# Patient Record
Sex: Female | Born: 1950 | Race: White | Hispanic: No | Smoking: Former smoker
Health system: Southern US, Community
[De-identification: ages and names within clinical notes are randomized; demographics above are authoritative.]

## PROBLEM LIST (undated history)

## (undated) DIAGNOSIS — M8580 Other specified disorders of bone density and structure, unspecified site: Secondary | ICD-10-CM

## (undated) DIAGNOSIS — F32A Depression, unspecified: Secondary | ICD-10-CM

## (undated) DIAGNOSIS — M5412 Radiculopathy, cervical region: Secondary | ICD-10-CM

## (undated) DIAGNOSIS — F419 Anxiety disorder, unspecified: Secondary | ICD-10-CM

## (undated) DIAGNOSIS — M549 Dorsalgia, unspecified: Secondary | ICD-10-CM

## (undated) DIAGNOSIS — M545 Low back pain, unspecified: Secondary | ICD-10-CM

## (undated) DIAGNOSIS — R7989 Other specified abnormal findings of blood chemistry: Secondary | ICD-10-CM

## (undated) DIAGNOSIS — R748 Abnormal levels of other serum enzymes: Secondary | ICD-10-CM

## (undated) DIAGNOSIS — G4700 Insomnia, unspecified: Secondary | ICD-10-CM

## (undated) DIAGNOSIS — K589 Irritable bowel syndrome without diarrhea: Secondary | ICD-10-CM

## (undated) DIAGNOSIS — M542 Cervicalgia: Secondary | ICD-10-CM

## (undated) DIAGNOSIS — F329 Major depressive disorder, single episode, unspecified: Secondary | ICD-10-CM

## (undated) HISTORY — DX: Anxiety disorder, unspecified: F41.9

## (undated) HISTORY — DX: Low back pain: M54.5

## (undated) HISTORY — PX: OTHER SURGICAL HISTORY: SHX169

## (undated) HISTORY — DX: Dorsalgia, unspecified: M54.9

## (undated) HISTORY — PX: CHOLESTEATOMA EXCISION: SHX1345

## (undated) HISTORY — DX: Other specified disorders of bone density and structure, unspecified site: M85.80

## (undated) HISTORY — DX: Abnormal levels of other serum enzymes: R74.8

## (undated) HISTORY — DX: Depression, unspecified: F32.A

## (undated) HISTORY — DX: Insomnia, unspecified: G47.00

## (undated) HISTORY — DX: Irritable bowel syndrome, unspecified: K58.9

## (undated) HISTORY — DX: Other specified abnormal findings of blood chemistry: R79.89

## (undated) HISTORY — DX: Low back pain, unspecified: M54.50

## (undated) HISTORY — PX: CHOLECYSTECTOMY: SHX55

## (undated) HISTORY — PX: SKIN GRAFT: SHX250

## (undated) HISTORY — DX: Radiculopathy, cervical region: M54.12

## (undated) HISTORY — DX: Cervicalgia: M54.2

## (undated) HISTORY — DX: Major depressive disorder, single episode, unspecified: F32.9

---

## 2004-06-05 LAB — HM COLONOSCOPY

## 2013-10-05 LAB — HM PAP SMEAR

## 2013-10-05 LAB — HM MAMMOGRAPHY

## 2013-10-29 LAB — HM DEXA SCAN

## 2015-11-27 ENCOUNTER — Ambulatory Visit: Payer: 59

## 2015-11-27 DIAGNOSIS — Z23 Encounter for immunization: Secondary | ICD-10-CM

## 2015-12-12 ENCOUNTER — Encounter: Payer: Self-pay | Admitting: Unknown Physician Specialty

## 2015-12-12 ENCOUNTER — Ambulatory Visit (INDEPENDENT_AMBULATORY_CARE_PROVIDER_SITE_OTHER): Payer: 59 | Admitting: Unknown Physician Specialty

## 2015-12-12 ENCOUNTER — Telehealth: Payer: Self-pay | Admitting: Unknown Physician Specialty

## 2015-12-12 VITALS — BP 145/94 | HR 62 | Temp 98.3°F | Ht 65.3 in | Wt 145.0 lb

## 2015-12-12 DIAGNOSIS — E785 Hyperlipidemia, unspecified: Secondary | ICD-10-CM

## 2015-12-12 DIAGNOSIS — F419 Anxiety disorder, unspecified: Secondary | ICD-10-CM

## 2015-12-12 DIAGNOSIS — R059 Cough, unspecified: Secondary | ICD-10-CM

## 2015-12-12 DIAGNOSIS — R05 Cough: Secondary | ICD-10-CM

## 2015-12-12 DIAGNOSIS — F339 Major depressive disorder, recurrent, unspecified: Secondary | ICD-10-CM | POA: Insufficient documentation

## 2015-12-12 DIAGNOSIS — M545 Low back pain, unspecified: Secondary | ICD-10-CM | POA: Insufficient documentation

## 2015-12-12 DIAGNOSIS — M85852 Other specified disorders of bone density and structure, left thigh: Secondary | ICD-10-CM | POA: Insufficient documentation

## 2015-12-12 DIAGNOSIS — R748 Abnormal levels of other serum enzymes: Secondary | ICD-10-CM

## 2015-12-12 DIAGNOSIS — G4700 Insomnia, unspecified: Secondary | ICD-10-CM | POA: Insufficient documentation

## 2015-12-12 DIAGNOSIS — M8580 Other specified disorders of bone density and structure, unspecified site: Secondary | ICD-10-CM

## 2015-12-12 LAB — INFLUENZA A AND B
INFLUENZA B AG, EIA: NEGATIVE
Influenza A Ag, EIA: POSITIVE — AB

## 2015-12-12 MED ORDER — OSELTAMIVIR PHOSPHATE 75 MG PO CAPS: 75.0000 mg | ORAL_CAPSULE | Freq: Two times a day (BID) | ORAL | Status: DC

## 2015-12-12 MED ORDER — OSELTAMIVIR PHOSPHATE 30 MG PO CAPS: 30.0000 mg | ORAL_CAPSULE | Freq: Two times a day (BID) | ORAL | Status: DC

## 2015-12-12 NOTE — Telephone Encounter (Signed)
[facility] says she can't find tamiflu  but they do have it in  and would like to know if the rx can be changed.

## 2015-12-12 NOTE — Telephone Encounter (Signed)
Wrote another rx

## 2015-12-12 NOTE — Progress Notes (Signed)
--  j---  BP 145/94 mmHg  Pulse 62  Temp(Src) 98.3 F (36.8 C)  Ht 5' 5.3" (1.659 m)  Wt 145 lb (65.772 kg)  BMI 23.90 kg/m2  SpO2 99%  LMP  (LMP Unknown)   Subjective:    Patient ID: Kristen May, female    DOB: December 05, 1950, 65 y.o.   MRN: [id number]  HPI: Kristen May is a 65 y.o. female  Chief Complaint  Patient presents with  . URI    pt states she has been sneezing, having a stuffy nose, some congestion, runny nose, chills, and body aches. States symptoms started yesterday.    Relevant past medical, surgical, family and social history reviewed and updated as indicated. Interim medical history since our last visit reviewed. Allergies and medications reviewed and updated.  Review of Systems  Per HPI unless specifically indicated above     Objective:    BP 145/94 mmHg  Pulse 62  Temp(Src) 98.3 F (36.8 C)  Ht 5' 5.3" (1.659 m)  Wt 145 lb (65.772 kg)  BMI 23.90 kg/m2  SpO2 99%  LMP  (LMP Unknown)  Wt Readings from Last 3 Encounters:  12/12/15 145 lb (65.772 kg)  01/31/15 162 lb (73.483 kg)    Physical Exam  Constitutional: She is oriented to person, place, and time. She appears well-developed and well-nourished. No distress.  HENT:  Head: Normocephalic and atraumatic.  Right Ear: Tympanic membrane and ear canal normal.  Left Ear: Tympanic membrane and ear canal normal.  Nose: Rhinorrhea present. Right sinus exhibits no maxillary sinus tenderness and no frontal sinus tenderness. Left sinus exhibits no maxillary sinus tenderness and no frontal sinus tenderness.  Mouth/Throat: Mucous membranes are normal. Posterior oropharyngeal erythema present.  Eyes: Conjunctivae and lids are normal. Right eye exhibits no discharge. Left eye exhibits no discharge. No scleral icterus.  Cardiovascular: Normal rate and regular rhythm.   Pulmonary/Chest: Effort normal and breath sounds normal. No respiratory distress.  Abdominal: Normal appearance. There is no splenomegaly or  hepatomegaly.  Musculoskeletal: Normal range of motion.  Neurological: She is alert and oriented to person, place, and time.  Skin: Skin is intact. No rash noted. No pallor.  Psychiatric: She has a normal mood and affect. Her behavior is normal. Judgment and thought content normal.  Nursing note and vitals reviewed.   Results for orders placed or performed in visit on 12/12/15  HM DEXA SCAN  Result Value Ref Range   HM Dexa Scan from [facility]   HM MAMMOGRAPHY  Result Value Ref Range   HM Mammogram patient reported from [facility]   HM PAP SMEAR  Result Value Ref Range   HM Pap smear from [facility]   HM COLONOSCOPY  Result Value Ref Range   HM Colonoscopy from [facility]       Assessment & Plan:   Problem List Items Addressed This Visit    None    Visit Diagnoses    Cough    -  Primary    positive for flu.  Rx for Tamiflu    Relevant Orders    Influenza a and b        Follow up plan: Return if symptoms worsen or fail to improve.

## 2015-12-12 NOTE — Telephone Encounter (Signed)
Routing to provider  

## 2015-12-22 ENCOUNTER — Ambulatory Visit (INDEPENDENT_AMBULATORY_CARE_PROVIDER_SITE_OTHER): Payer: 59 | Admitting: Unknown Physician Specialty

## 2015-12-22 ENCOUNTER — Encounter: Payer: Self-pay | Admitting: Unknown Physician Specialty

## 2015-12-22 ENCOUNTER — Ambulatory Visit
Admission: RE | Admit: 2015-12-22 | Discharge: 2015-12-22 | Disposition: A | Discharge status: Home / Self Care | Payer: 59 | Source: Ambulatory Visit | Attending: Unknown Physician Specialty | Admitting: Unknown Physician Specialty

## 2015-12-22 VITALS — BP 126/86 | HR 69 | Temp 98.2°F | Ht 65.5 in | Wt 149.0 lb

## 2015-12-22 DIAGNOSIS — R0789 Other chest pain: Secondary | ICD-10-CM | POA: Insufficient documentation

## 2015-12-22 DIAGNOSIS — M546 Pain in thoracic spine: Secondary | ICD-10-CM | POA: Diagnosis not present

## 2015-12-22 MED ORDER — NAPROXEN 500 MG PO TABS: 500.0000 mg | ORAL_TABLET | Freq: Two times a day (BID) | ORAL | Status: DC

## 2015-12-22 MED ORDER — CYCLOBENZAPRINE HCL 10 MG PO TABS: 10.0000 mg | ORAL_TABLET | Freq: Every day | ORAL | Status: DC
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
## 2016-01-20 ENCOUNTER — Encounter: Payer: Self-pay | Admitting: Family Medicine

## 2016-01-20 ENCOUNTER — Ambulatory Visit (INDEPENDENT_AMBULATORY_CARE_PROVIDER_SITE_OTHER): Payer: 59 | Admitting: Family Medicine

## 2016-01-20 VITALS — BP 149/84 | HR 59 | Temp 98.1°F | Ht 64.6 in | Wt 146.0 lb

## 2016-01-20 DIAGNOSIS — J029 Acute pharyngitis, unspecified: Secondary | ICD-10-CM

## 2016-01-20 MED ORDER — BENZONATATE 200 MG PO CAPS: 200.0000 mg | ORAL_CAPSULE | Freq: Two times a day (BID) | ORAL | Status: DC | PRN

## 2016-01-20 NOTE — Progress Notes (Signed)
BP 149/84 mmHg  Pulse 59  Temp(Src) 98.1 F (36.7 C)  Ht 5' 4.6" (1.641 m)  Wt 146 lb (66.225 kg)  BMI 24.59 kg/m2  SpO2 98%  LMP  (LMP Unknown)   Subjective:    Patient ID: Kristen May, female    DOB: 1950/11/30, 65 y.o.   MRN: [id number]  HPI: Kristen May is a 65 y.o. female  Chief Complaint  Patient presents with  . Sore Throat    patient states that on Sunday her throat felt as if it was constricted. Now it is sore   UPPER RESPIRATORY TRACT INFECTION Duration: Since sunday Worst symptom: Sore throat Fever: no Cough: no Shortness of breath: no Wheezing: no Chest pain: no Chest tightness: no Chest congestion: no Nasal congestion: no Runny nose: no Post nasal drip: yes Sneezing: no Sore throat: yes Swollen glands: unknown Sinus pressure: no Headache: no Face pain: no Toothache: no Ear pain: no  Ear pressure: no  Eyes red/itching:yes Eye drainage/crusting: no  Vomiting: no Rash: no Fatigue: yes Sick contacts: no Strep contacts: no  Context: worse Recurrent sinusitis: no Relief with OTC cold/cough medications: no  Treatments attempted: none  Nothing getting stuck Not feeling hoarse.  Description of symptom: Throat feels tight Onset: Just there, not a problem with the swollowing Location of dysphagia: throat  Relevant past medical, surgical, family and social history reviewed and updated as indicated. Interim medical history since our last visit reviewed. Allergies and medications reviewed and updated.  Review of Systems  Constitutional: Negative.   HENT: Positive for postnasal drip, rhinorrhea and sore throat. Negative for congestion, dental problem, drooling, ear discharge, ear pain, facial swelling, hearing loss, mouth sores, nosebleeds, sinus pressure, sneezing, tinnitus, trouble swallowing and voice change.   Respiratory: Negative.   Cardiovascular: Negative.   Psychiatric/Behavioral: Negative.     Per HPI unless specifically indicated above      Objective:    BP 149/84 mmHg  Pulse 59  Temp(Src) 98.1 F (36.7 C)  Ht 5' 4.6" (1.641 m)  Wt 146 lb (66.225 kg)  BMI 24.59 kg/m2  SpO2 98%  LMP  (LMP Unknown)  Wt Readings from Last 3 Encounters:  01/20/16 146 lb (66.225 kg)  12/22/15 149 lb (67.586 kg)  12/12/15 145 lb (65.772 kg)    Physical Exam  Constitutional: She is oriented to person, place, and time. She appears well-developed and well-nourished. No distress.  HENT:  Head: Normocephalic and atraumatic.  Right Ear: Hearing and external ear normal.  Left Ear: Hearing and external ear normal.  Nose: Nose normal.  Mouth/Throat: Oropharynx is clear and moist. No oropharyngeal exudate.  + Post nasal drip   Eyes: Conjunctivae, EOM and lids are normal. Pupils are equal, round, and reactive to light. Right eye exhibits no discharge. Left eye exhibits no discharge. No scleral icterus.  Neck: Normal range of motion. Neck supple. No JVD present. No tracheal deviation present. No thyromegaly present.  Cardiovascular: Normal rate, regular rhythm, normal heart sounds and intact distal pulses.  Exam reveals no gallop and no friction rub.   No murmur heard. Pulmonary/Chest: Effort normal and breath sounds normal. No stridor. No respiratory distress. She has no wheezes. She has no rales. She exhibits no tenderness.  Musculoskeletal: Normal range of motion.  Lymphadenopathy:    She has cervical adenopathy.  Neurological: She is alert and oriented to person, place, and time.  Skin: Skin is warm, dry and intact. No rash noted. She is not diaphoretic. No pallor.  Psychiatric:  She has a normal mood and affect. Her speech is normal and behavior is normal. Judgment and thought content normal. Cognition and memory are normal.  Nursing note and vitals reviewed.       Assessment & Plan:   Problem List Items Addressed This Visit    None    Visit Diagnoses    Pharyngitis    -  Primary    Will treat with tessalon perles for comfort.  Continue to monitor. Call if not getting better or getting worse. Recommended OTC antihistamines.         Follow up plan: Return if symptoms worsen or fail to improve.

## 2016-01-20 NOTE — Patient Instructions (Signed)

## 2016-04-02 ENCOUNTER — Ambulatory Visit (INDEPENDENT_AMBULATORY_CARE_PROVIDER_SITE_OTHER): Payer: 59 | Admitting: Unknown Physician Specialty

## 2016-04-02 ENCOUNTER — Encounter: Payer: Self-pay | Admitting: Unknown Physician Specialty

## 2016-04-02 VITALS — BP 123/83 | HR 60 | Temp 98.1°F | Ht 65.6 in | Wt 144.2 lb

## 2016-04-02 DIAGNOSIS — E785 Hyperlipidemia, unspecified: Secondary | ICD-10-CM | POA: Diagnosis not present

## 2016-04-02 DIAGNOSIS — M722 Plantar fascial fibromatosis: Secondary | ICD-10-CM | POA: Insufficient documentation

## 2016-04-02 DIAGNOSIS — H34211 Partial retinal artery occlusion, right eye: Secondary | ICD-10-CM | POA: Diagnosis not present

## 2016-04-02 NOTE — Patient Instructions (Addendum)
Magnesium supplement.  I prefer Magnesium Citrate (I use something called Calm and Relax) which is better absorbed then the more common Magnesium Oxide.    Consider OTC arch supports     Plantar Fasciitis Plantar fasciitis is a painful foot condition that affects the heel. It occurs when the band of tissue that connects the toes to the heel bone (plantar fascia) becomes irritated. This can happen after exercising too much or doing other repetitive activities (overuse injury). The pain from plantar fasciitis can range from mild irritation to severe pain that makes it difficult for you to walk or move. The pain is usually worse in the morning or after you have been sitting or lying down for a while. CAUSES This condition may be caused by:  Standing for long periods of time.  Wearing shoes that do not fit.  Doing high-impact activities, including running, aerobics, and ballet.  Being overweight.  Having an abnormal way of walking (gait).  Having tight calf muscles.  Having high arches in your feet.  Starting a new athletic activity. SYMPTOMS The main symptom of this condition is heel pain. Other symptoms include:  Pain that gets worse after activity or exercise.  Pain that is worse in the morning or after resting.  Pain that goes away after you walk for a few minutes. DIAGNOSIS This condition may be diagnosed based on your signs and symptoms. Your health care provider will also do a physical exam to check for:  A tender area on the bottom of your foot.  A high arch in your foot.  Pain when you move your foot.  Difficulty moving your foot. You may also need to have imaging studies to confirm the diagnosis. These can include:  X-rays.  Ultrasound.  MRI. TREATMENT  Treatment for plantar fasciitis depends on the severity of the condition. Your treatment may include:  Rest, ice, and over-the-counter pain medicines to manage your pain.  Exercises to stretch your calves  and your plantar fascia.  A splint that holds your foot in a stretched, upward position while you sleep (night splint).  Physical therapy to relieve symptoms and prevent problems in the future.  Cortisone injections to relieve severe pain.  Extracorporeal shock wave therapy (ESWT) to stimulate damaged plantar fascia with electrical impulses. It is often used as a last resort before surgery.  Surgery, if other treatments have not worked after 12 months. HOME CARE INSTRUCTIONS  Take medicines only as directed by your health care provider.  Avoid activities that cause pain.  Roll the bottom of your foot over a bag of ice or a bottle of cold water. Do this for 20 minutes, 3-4 times a day.  Perform simple stretches as directed by your health care provider.  Try wearing athletic shoes with air-sole or gel-sole cushions or soft shoe inserts.  Wear a night splint while sleeping, if directed by your health care provider.  Keep all follow-up appointments with your health care provider. PREVENTION   Do not perform exercises or activities that cause heel pain.  Consider finding low-impact activities if you continue to have problems.  Lose weight if you need to. The best way to prevent plantar fasciitis is to avoid the activities that aggravate your plantar fascia. SEEK MEDICAL CARE IF:  Your symptoms do not go away after treatment with home care measures.  Your pain gets worse.  Your pain affects your ability to move or do your daily activities.   This information is not intended to  replace advice given to you by your health care provider. Make sure you discuss any questions you have with your health care provider.   Document Released: 07/27/2001 Document Revised: 07/23/2015 Document Reviewed: 09/11/2014 Elsevier Interactive Patient Education [employer]2016 Elsevier Inc.

## 2016-04-02 NOTE — Assessment & Plan Note (Addendum)
This is a sign of possible carotid artery disease.  Will order carotid doppler.  Discussed with Dr. Laural BenesJohnson.  Start ASA/day

## 2016-04-02 NOTE — Assessment & Plan Note (Signed)
Discussed exercises, Magnesium

## 2016-04-02 NOTE — Assessment & Plan Note (Addendum)
LDL is 138.  Discussed advanced lipid testing and she is interested.  Will do this next visit

## 2016-04-02 NOTE — Progress Notes (Signed)
BP 123/83 mmHg  Pulse 60  Temp(Src) 98.1 F (36.7 C)  Ht 5' 5.6" (1.666 m)  Wt 144 lb 3.2 oz (65.409 kg)  BMI 23.57 kg/m2  SpO2 99%  LMP  (LMP Unknown)   Subjective:    Patient ID: Kristen May, female    DOB: 1951/06/18, 65 y.o.   MRN: [id number]  HPI: Kristen May is a 65 y.o. female  Chief Complaint  Patient presents with  . Follow-up    f/u from eye exam   I got a call from her Optometrist noting a "new plaque" in her high indicating the need for carotid assessment.  She does have a history of high cholesterol  But not in statin benefit group.   She is a former smoker with heart disease in the family.    Plantar Fasciitis Beginning to bother her.  States it is interfering with her running  Social History   Social History  . Marital Status: Married    Spouse Name: N/A  . Number of Children: N/A  . Years of Education: N/A   Occupational History  . Not on file.   Social History Main Topics  . Smoking status: Former Games developer  . Smokeless tobacco: Never Used  . Alcohol Use: 0.0 oz/week    0 Standard drinks or equivalent per week     Comment: on occasion  . Drug Use: No  . Sexual Activity: Not on file   Other Topics Concern  . Not on file   Social History Narrative   Family History  Problem Relation Age of Onset  . Cancer Mother     stomach  . COPD Mother   . Heart disease Father   . Cancer Father     colon  . Diabetes Father      Relevant past medical, surgical, family and social history reviewed and updated as indicated. Interim medical history since our last visit reviewed. Allergies and medications reviewed and updated.  Review of Systems  Constitutional: Negative.   HENT: Negative.   Eyes: Negative.   Respiratory: Negative.   Cardiovascular: Negative.   Gastrointestinal: Negative.   Endocrine: Negative.   Genitourinary: Negative.   Musculoskeletal:       Plantar facsiitis  Skin: Negative.   Allergic/Immunologic: Negative.   Neurological:  Negative.   Hematological: Negative.   Psychiatric/Behavioral: Negative.     Per HPI unless specifically indicated above     Objective:    BP 123/83 mmHg  Pulse 60  Temp(Src) 98.1 F (36.7 C)  Ht 5' 5.6" (1.666 m)  Wt 144 lb 3.2 oz (65.409 kg)  BMI 23.57 kg/m2  SpO2 99%  LMP  (LMP Unknown)  Wt Readings from Last 3 Encounters:  04/02/16 144 lb 3.2 oz (65.409 kg)  01/20/16 146 lb (66.225 kg)  12/22/15 149 lb (67.586 kg)    Physical Exam  Constitutional: She is oriented to person, place, and time. She appears well-developed and well-nourished. No distress.  HENT:  Head: Normocephalic and atraumatic.  Eyes: Conjunctivae and lids are normal. Right eye exhibits no discharge. Left eye exhibits no discharge. No scleral icterus.  Neck: Normal range of motion. Neck supple. Normal carotid pulses, no hepatojugular reflux and no JVD present. Carotid bruit is not present.  Cardiovascular: Normal rate, regular rhythm and normal heart sounds.   Pulmonary/Chest: Effort normal and breath sounds normal.  Abdominal: Normal appearance. There is no splenomegaly or hepatomegaly.  Musculoskeletal: Normal range of motion.  Neurological: She is alert and oriented  to person, place, and time.  Skin: Skin is warm, dry and intact. No rash noted. No pallor.  Psychiatric: She has a normal mood and affect. Her behavior is normal. Judgment and thought content normal.      Assessment & Plan:   Problem List Items Addressed This Visit      Unprioritized   Hollenhorst plaque, right eye    This is a sign of possible carotid artery disease.  Will order carotid doppler.  Discussed with Dr. Laural BenesJohnson.  Start ASA/day      Relevant Orders   US Carotid Duplex Bilateral   Hyperlipidemia - Primary    LDL is 138.  Discussed advanced lipid testing and she is interested.  Will do this next visit      Relevant Orders   Lipid Panel Piccolo, Waived   Comprehensive metabolic panel   Plantar fasciitis    Discussed  exercises, Magnesium          Follow up plan: Return for schedule PE.

## 2016-04-03 LAB — COMPREHENSIVE METABOLIC PANEL
A/G RATIO: 1.8 (ref 1.2–2.2)
ALBUMIN: 4.2 g/dL (ref 3.6–4.8)
ALT: 18 IU/L (ref 0–32)
AST: 19 IU/L (ref 0–40)
Alkaline Phosphatase: 60 IU/L (ref 39–117)
BUN / CREAT RATIO: 21 (ref 12–28)
BUN: 18 mg/dL (ref 8–27)
Bilirubin Total: 0.5 mg/dL (ref 0.0–1.2)
CALCIUM: 9.2 mg/dL (ref 8.7–10.3)
CO2: 23 mmol/L (ref 18–29)
Chloride: 103 mmol/L (ref 96–106)
Creatinine, Ser: 0.85 mg/dL (ref 0.57–1.00)
GFR, EST AFRICAN AMERICAN: 84 mL/min/{1.73_m2} (ref >59)
GFR, EST NON AFRICAN AMERICAN: 73 mL/min/{1.73_m2} (ref >59)
GLOBULIN, TOTAL: 2.3 g/dL (ref 1.5–4.5)
Glucose: 88 mg/dL (ref 65–99)
POTASSIUM: 3.9 mmol/L (ref 3.5–5.2)
SODIUM: 142 mmol/L (ref 134–144)
TOTAL PROTEIN: 6.5 g/dL (ref 6.0–8.5)

## 2016-04-03 LAB — LIPID PANEL PICCOLO, WAIVED
Chol/HDL Ratio Piccolo,Waive: 2.9 mg/dL
Cholesterol Piccolo, Waived: 235 mg/dL — ABNORMAL HIGH (ref <200)
HDL Chol Piccolo, Waived: 81 mg/dL (ref >59)
LDL Chol Calc Piccolo Waived: 138 mg/dL — ABNORMAL HIGH (ref <100)
TRIGLYCERIDES PICCOLO,WAIVED: 79 mg/dL (ref <150)
VLDL CHOL CALC PICCOLO,WAIVE: 16 mg/dL (ref <30)

## 2016-04-19 ENCOUNTER — Ambulatory Visit
Admission: RE | Admit: 2016-04-19 | Discharge: 2016-04-19 | Disposition: A | Discharge status: Home / Self Care | Payer: 59 | Source: Ambulatory Visit | Attending: Unknown Physician Specialty | Admitting: Unknown Physician Specialty

## 2016-04-19 DIAGNOSIS — R938 Abnormal findings on diagnostic imaging of other specified body structures: Secondary | ICD-10-CM | POA: Insufficient documentation

## 2016-04-19 DIAGNOSIS — H34211 Partial retinal artery occlusion, right eye: Secondary | ICD-10-CM | POA: Diagnosis present

## 2016-04-22 ENCOUNTER — Encounter: Payer: Self-pay | Admitting: Unknown Physician Specialty

## 2016-05-12 ENCOUNTER — Encounter: Payer: 59 | Admitting: Unknown Physician Specialty

## 2016-06-09 ENCOUNTER — Encounter: Payer: 59 | Admitting: Unknown Physician Specialty

## 2016-08-23 ENCOUNTER — Encounter: Payer: Self-pay | Admitting: Unknown Physician Specialty

## 2016-08-23 ENCOUNTER — Ambulatory Visit (INDEPENDENT_AMBULATORY_CARE_PROVIDER_SITE_OTHER): Payer: 59 | Admitting: Unknown Physician Specialty

## 2016-08-23 VITALS — BP 133/83 | HR 66 | Temp 98.4°F | Ht 65.5 in | Wt 146.8 lb

## 2016-08-23 DIAGNOSIS — Z0000 Encounter for general adult medical examination without abnormal findings: Secondary | ICD-10-CM

## 2016-08-23 DIAGNOSIS — Z23 Encounter for immunization: Secondary | ICD-10-CM | POA: Diagnosis not present

## 2016-08-23 NOTE — Patient Instructions (Addendum)

## 2016-08-23 NOTE — Progress Notes (Signed)
BP 133/83 (BP Location: Left Arm, Patient Position: Sitting, Cuff Size: Normal)   Pulse 66   Temp 98.4 F (36.9 C)   Ht 5' 5.5" (1.664 m)   Wt 146 lb 12.8 oz (66.6 kg)   LMP  (LMP Unknown)   SpO2 98%   BMI 24.06 kg/m    Subjective:    Patient ID: Kristen LacksSherry Renbarger, female    DOB: 12/29/1950, 65 y.o.   MRN: [id number]  HPI: Kristen May is a 65 y.o. female  Chief Complaint  Patient presents with  . Annual Exam   Social History   Social History  . Marital status: Widowed    Spouse name: N/A  . Number of children: N/A  . Years of education: N/A   Occupational History  . Not on file.   Social History Main Topics  . Smoking status: Former Games developermoker  . Smokeless tobacco: Never Used  . Alcohol use 0.0 oz/week     Comment: on occasion  . Drug use: No  . Sexual activity: Yes   Other Topics Concern  . Not on file   Social History Narrative  . No narrative on file   Family History  Problem Relation Age of Onset  . Cancer Mother     stomach  . COPD Mother   . Heart disease Father   . Cancer Father     colon  . Diabetes Father   . Polycystic ovary syndrome Daughter   . Cancer Paternal Grandfather     rectal  . Depression Daughter   . Bipolar disorder Daughter    Past Medical History:  Diagnosis Date  . Anxiety   . Back pain   . Cervicalgia   . Depression   . Elevated vitamin B12 level   . Insomnia   . Lumbago   . Osteopenia    Past Surgical History:  Procedure Laterality Date  . CHOLECYSTECTOMY    . CHOLESTEATOMA EXCISION    . SKIN GRAFT       Relevant past medical, surgical, family and social history reviewed and updated as indicated. Interim medical history since our last visit reviewed. Allergies and medications reviewed and updated.  Review of Systems  Per HPI unless specifically indicated above     Objective:    BP 133/83 (BP Location: Left Arm, Patient Position: Sitting, Cuff Size: Normal)   Pulse 66   Temp 98.4 F (36.9 C)   Ht 5' 5.5"  (1.664 m)   Wt 146 lb 12.8 oz (66.6 kg)   LMP  (LMP Unknown)   SpO2 98%   BMI 24.06 kg/m   Wt Readings from Last 3 Encounters:  08/23/16 146 lb 12.8 oz (66.6 kg)  04/02/16 144 lb 3.2 oz (65.4 kg)  01/20/16 146 lb (66.2 kg)    Physical Exam  Constitutional: She is oriented to person, place, and time. She appears well-developed and well-nourished.  HENT:  Head: Normocephalic and atraumatic.  Eyes: Pupils are equal, round, and reactive to light. Right eye exhibits no discharge. Left eye exhibits no discharge. No scleral icterus.  Neck: Normal range of motion. Neck supple. Carotid bruit is not present. No thyromegaly present.  Cardiovascular: Normal rate, regular rhythm and normal heart sounds.  Exam reveals no gallop and no friction rub.   No murmur heard. Pulmonary/Chest: Effort normal and breath sounds normal. No respiratory distress. She has no wheezes. She has no rales.  Abdominal: Soft. Bowel sounds are normal. There is no tenderness. There is no rebound.  Genitourinary: Vagina normal and uterus normal. No breast swelling, tenderness or discharge. Cervix exhibits no motion tenderness, no discharge and no friability. Right adnexum displays no mass, no tenderness and no fullness. Left adnexum displays no mass, no tenderness and no fullness.  Musculoskeletal: Normal range of motion.  Lymphadenopathy:    She has no cervical adenopathy.  Neurological: She is alert and oriented to person, place, and time.  Skin: Skin is warm, dry and intact. No rash noted.  Psychiatric: She has a normal mood and affect. Her speech is normal and behavior is normal. Judgment and thought content normal. Cognition and memory are normal.    Results for orders placed or performed in visit on 04/02/16  Comprehensive metabolic panel  Result Value Ref Range   Glucose 88 65 - 99 mg/dL   BUN 18 8 - 27 mg/dL   Creatinine, Ser 1.61 0.57 - 1.00 mg/dL   GFR calc non Af Amer 73 >59 mL/min/1.73   GFR calc Af Amer 84  >59 mL/min/1.73   BUN/Creatinine Ratio 21 12 - 28   Sodium 142 134 - 144 mmol/L   Potassium 3.9 3.5 - 5.2 mmol/L   Chloride 103 96 - 106 mmol/L   CO2 23 18 - 29 mmol/L   Calcium 9.2 8.7 - 10.3 mg/dL   Total Protein 6.5 6.0 - 8.5 g/dL   Albumin 4.2 3.6 - 4.8 g/dL   Globulin, Total 2.3 1.5 - 4.5 g/dL   Albumin/Globulin Ratio 1.8 1.2 - 2.2   Bilirubin Total 0.5 0.0 - 1.2 mg/dL   Alkaline Phosphatase 60 39 - 117 IU/L   AST 19 0 - 40 IU/L   ALT 18 0 - 32 IU/L  Lipid Panel Piccolo, Waived  Result Value Ref Range   Cholesterol Piccolo, Waived 235 (H) <200 mg/dL   HDL Chol Piccolo, Waived 81 >59 mg/dL   Triglycerides Piccolo,Waived 79 <150 mg/dL   Chol/HDL Ratio Piccolo,Waive 2.9 mg/dL   LDL Chol Calc Piccolo Waived 138 (H) <100 mg/dL   VLDL Chol Calc Piccolo,Waive 16 <30 mg/dL      Assessment & Plan:   Problem List Items Addressed This Visit    None    Visit Diagnoses    Need for influenza vaccination    -  Primary   Relevant Orders   Flu Vaccine QUAD 36+ mos IM (Completed)   Annual physical exam       Relevant Orders   CBC with Differential/Platelet   Comprehensive metabolic panel   Lipid Panel w/o Chol/HDL Ratio   IGP, Aptima HPV, rfx 16/18,45   TSH   MM DIGITAL SCREENING BILATERAL   VITAMIN D 25 Hydroxy (Vit-D Deficiency, Fractures)   Ambulatory referral to Gastroenterology       Follow up plan: Return in about 1 year (around 08/23/2017).

## 2016-08-24 ENCOUNTER — Encounter: Payer: Self-pay | Admitting: Unknown Physician Specialty

## 2016-08-24 LAB — CBC WITH DIFFERENTIAL/PLATELET
BASOS ABS: 0 10*3/uL (ref 0.0–0.2)
Basos: 1 %
EOS (ABSOLUTE): 0.4 10*3/uL (ref 0.0–0.4)
Eos: 6 %
Hematocrit: 39.1 % (ref 34.0–46.6)
Hemoglobin: 13.2 g/dL (ref 11.1–15.9)
Immature Grans (Abs): 0 10*3/uL (ref 0.0–0.1)
Immature Granulocytes: 0 %
LYMPHS ABS: 2.3 10*3/uL (ref 0.7–3.1)
Lymphs: 39 %
MCH: 32.4 pg (ref 26.6–33.0)
MCHC: 33.8 g/dL (ref 31.5–35.7)
MCV: 96 fL (ref 79–97)
Monocytes Absolute: 0.5 10*3/uL (ref 0.1–0.9)
Monocytes: 8 %
NEUTROS ABS: 2.7 10*3/uL (ref 1.4–7.0)
Neutrophils: 46 %
PLATELETS: 257 10*3/uL (ref 150–379)
RBC: 4.08 x10E6/uL (ref 3.77–5.28)
RDW: 12.7 % (ref 12.3–15.4)
WBC: 5.8 10*3/uL (ref 3.4–10.8)

## 2016-08-24 LAB — LIPID PANEL W/O CHOL/HDL RATIO
CHOLESTEROL TOTAL: 224 mg/dL — AB (ref 100–199)
HDL: 98 mg/dL (ref >39)
LDL Calculated: 111 mg/dL — ABNORMAL HIGH (ref 0–99)
Triglycerides: 76 mg/dL (ref 0–149)
VLDL CHOLESTEROL CAL: 15 mg/dL (ref 5–40)

## 2016-08-24 LAB — COMPREHENSIVE METABOLIC PANEL
ALBUMIN: 4.4 g/dL (ref 3.6–4.8)
ALK PHOS: 55 IU/L (ref 39–117)
ALT: 19 IU/L (ref 0–32)
AST: 21 IU/L (ref 0–40)
Albumin/Globulin Ratio: 1.7 (ref 1.2–2.2)
BILIRUBIN TOTAL: 0.5 mg/dL (ref 0.0–1.2)
BUN / CREAT RATIO: 15 (ref 12–28)
BUN: 12 mg/dL (ref 8–27)
CHLORIDE: 105 mmol/L (ref 96–106)
CO2: 24 mmol/L (ref 18–29)
Calcium: 9.6 mg/dL (ref 8.7–10.3)
Creatinine, Ser: 0.79 mg/dL (ref 0.57–1.00)
GFR calc Af Amer: 91 mL/min/{1.73_m2} (ref >59)
GFR calc non Af Amer: 79 mL/min/{1.73_m2} (ref >59)
GLUCOSE: 88 mg/dL (ref 65–99)
Globulin, Total: 2.6 g/dL (ref 1.5–4.5)
Potassium: 4.3 mmol/L (ref 3.5–5.2)
Sodium: 144 mmol/L (ref 134–144)
Total Protein: 7 g/dL (ref 6.0–8.5)

## 2016-08-24 LAB — TSH: TSH: 0.932 u[IU]/mL (ref 0.450–4.500)

## 2016-08-24 LAB — VITAMIN D 25 HYDROXY (VIT D DEFICIENCY, FRACTURES): Vit D, 25-Hydroxy: 24.5 ng/mL — ABNORMAL LOW (ref 30.0–100.0)

## 2016-08-29 LAB — IGP, APTIMA HPV, RFX 16/18,45
HPV APTIMA: NEGATIVE
PAP Smear Comment: 0

## 2016-09-09 NOTE — Telephone Encounter (Signed)
error 

## 2016-12-24 ENCOUNTER — Encounter: Payer: Self-pay | Admitting: Unknown Physician Specialty

## 2016-12-24 ENCOUNTER — Ambulatory Visit (INDEPENDENT_AMBULATORY_CARE_PROVIDER_SITE_OTHER): Payer: 59 | Admitting: Unknown Physician Specialty

## 2016-12-24 VITALS — BP 143/86 | HR 56 | Temp 98.0°F | Wt 155.0 lb

## 2016-12-24 DIAGNOSIS — F5101 Primary insomnia: Secondary | ICD-10-CM

## 2016-12-24 DIAGNOSIS — R05 Cough: Secondary | ICD-10-CM | POA: Diagnosis not present

## 2016-12-24 DIAGNOSIS — J069 Acute upper respiratory infection, unspecified: Secondary | ICD-10-CM

## 2016-12-24 DIAGNOSIS — B9789 Other viral agents as the cause of diseases classified elsewhere: Secondary | ICD-10-CM

## 2016-12-24 DIAGNOSIS — R059 Cough, unspecified: Secondary | ICD-10-CM

## 2016-12-24 MED ORDER — BENZONATATE 200 MG PO CAPS: 200.0000 mg | ORAL_CAPSULE | Freq: Two times a day (BID) | ORAL | 0 refills | Status: DC | PRN

## 2016-12-24 MED ORDER — LORAZEPAM 1 MG PO TABS: 1.0000 mg | ORAL_TABLET | Freq: Every evening | ORAL | 0 refills | Status: DC | PRN

## 2016-12-24 NOTE — Progress Notes (Signed)
BP (!) 143/86 (BP Location: Left Arm, Patient Position: Sitting, Cuff Size: Normal)   Pulse (!) 56   Temp 98 F (36.7 C)   Wt 155 lb (70.3 kg)   LMP  (LMP Unknown)   SpO2 99%   BMI 25.40 kg/m    Subjective:    Patient ID: Kristen May, female    DOB: 08/07/1951, 66 y.o.   MRN: [id number]  HPI: Kristen LacksSherry Dillenbeck is a 66 y.o. female  Chief Complaint  Patient presents with  . Cough    pt states she has had a cough for about 3 weeks. States she has had a little sore throat and some congestion in the mornings.    Cough  This is a new problem. Episode onset: 3 weeks and very tired of being sick. The problem has been gradually improving. The cough is non-productive. Associated symptoms include myalgias, nasal congestion and a sore throat. Pertinent negatives include no fever or headaches. Associated symptoms comments: fatigue. Nothing aggravates the symptoms. She has tried nothing for the symptoms. The treatment provided no relief. There is no history of asthma or bronchitis.   Anxiety Pt has a former Lorazepam prescription written tin 2015.  Pt states she occasionally takes a 1/2 pill and it is almost out.  Takes it on occasion to help her sleep  Social History   Social History  . Marital status: Widowed    Spouse name: N/A  . Number of children: N/A  . Years of education: N/A   Occupational History  . Not on file.   Social History Main Topics  . Smoking status: Former Games developermoker  . Smokeless tobacco: Never Used  . Alcohol use 0.0 oz/week     Comment: on occasion  . Drug use: No  . Sexual activity: Yes   Other Topics Concern  . Not on file   Social History Narrative  . No narrative on file     Relevant past medical, surgical, family and social history reviewed and updated as indicated. Interim medical history since our last visit reviewed. Allergies and medications reviewed and updated.  Review of Systems  Constitutional: Negative for fever.  HENT: Positive for sore throat.    Respiratory: Positive for cough.   Musculoskeletal: Positive for myalgias.  Neurological: Negative for headaches.    Per HPI unless specifically indicated above     Objective:    BP (!) 143/86 (BP Location: Left Arm, Patient Position: Sitting, Cuff Size: Normal)   Pulse (!) 56   Temp 98 F (36.7 C)   Wt 155 lb (70.3 kg)   LMP  (LMP Unknown)   SpO2 99%   BMI 25.40 kg/m   Wt Readings from Last 3 Encounters:  12/24/16 155 lb (70.3 kg)  08/23/16 146 lb 12.8 oz (66.6 kg)  04/02/16 144 lb 3.2 oz (65.4 kg)    Physical Exam  Constitutional: She is oriented to person, place, and time. She appears well-developed and well-nourished. No distress.  HENT:  Head: Normocephalic and atraumatic.  Right Ear: Tympanic membrane and ear canal normal.  Left Ear: Tympanic membrane and ear canal normal.  Nose: Rhinorrhea present. Right sinus exhibits no maxillary sinus tenderness and no frontal sinus tenderness. Left sinus exhibits no maxillary sinus tenderness and no frontal sinus tenderness.  Mouth/Throat: Mucous membranes are normal. Posterior oropharyngeal erythema present.  Eyes: Conjunctivae and lids are normal. Right eye exhibits no discharge. Left eye exhibits no discharge. No scleral icterus.  Cardiovascular: Normal rate and regular rhythm.  Pulmonary/Chest: Effort normal and breath sounds normal. No respiratory distress.  Abdominal: Normal appearance. There is no splenomegaly or hepatomegaly.  Musculoskeletal: Normal range of motion.  Neurological: She is alert and oriented to person, place, and time.  Skin: Skin is intact. No rash noted. No pallor.  Psychiatric: She has a normal mood and affect. Her behavior is normal. Judgment and thought content normal.     Assessment & Plan:   Problem List Items Addressed This Visit      Unprioritized   Insomnia    Occasional issue and takes Lorazepam with last refill 2015       Other Visit Diagnoses    Cough    -  Primary   Post viral.   Pt education.  Rx for Tessalon   Relevant Orders   DG Chest 2 View   Viral upper respiratory tract infection       Relevant Orders   DG Chest 2 View       Follow up plan: Return if symptoms worsen or fail to improve.

## 2016-12-24 NOTE — Assessment & Plan Note (Signed)
Occasional issue and takes Lorazepam with last refill 2015

## 2017-03-14 ENCOUNTER — Telehealth: Payer: Self-pay | Admitting: Unknown Physician Specialty

## 2017-03-14 DIAGNOSIS — Z0000 Encounter for general adult medical examination without abnormal findings: Secondary | ICD-10-CM

## 2017-03-14 NOTE — Telephone Encounter (Signed)
Pt would like to have orders placed to have her colonoscopy.

## 2017-03-14 NOTE — Telephone Encounter (Signed)
Routing to provider  

## 2017-06-05 IMAGING — US US CAROTID DUPLEX BILAT
1 series · 13 of 24 positions shown · non-contrast
Comparison: None.

CLINICAL DATA: Hollenhorst plaque involving the right eye.

EXAM:
BILATERAL CAROTID DUPLEX ULTRASOUND
TECHNIQUE: Gray scale imaging, color Doppler and duplex ultrasound were
performed of bilateral carotid and vertebral arteries in the neck.

[Series 1: us carotid duplex bilat · 13 of 68 slices shown]
[im 1/68]
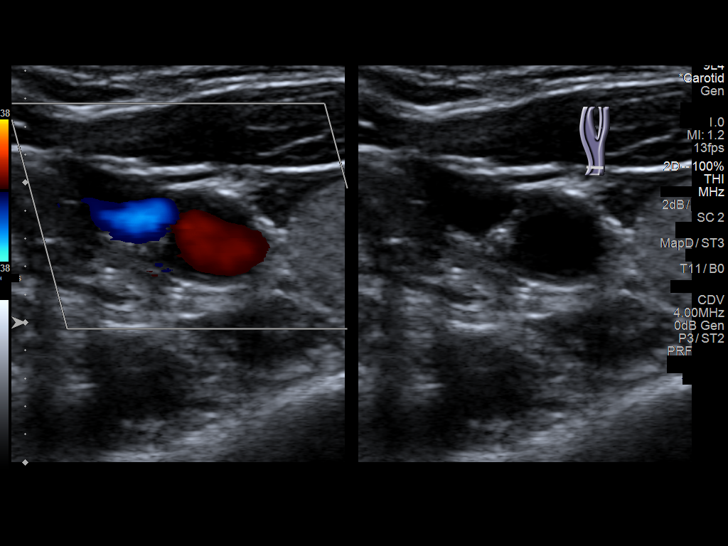
[im 6/68]
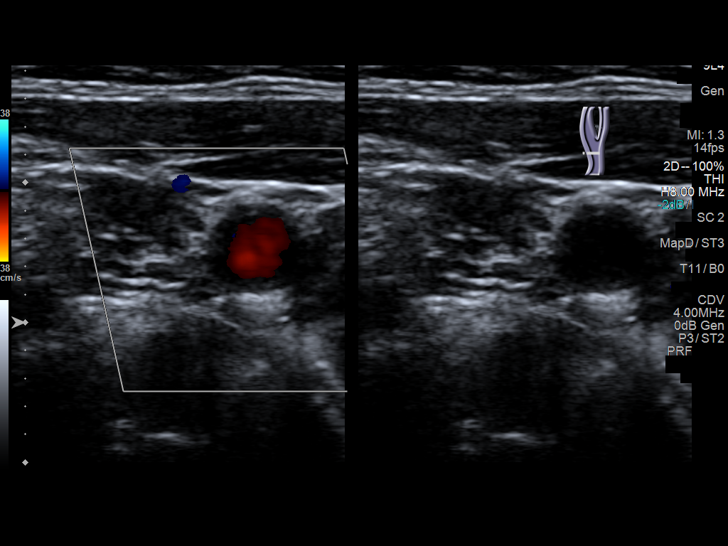
[im 12/68]
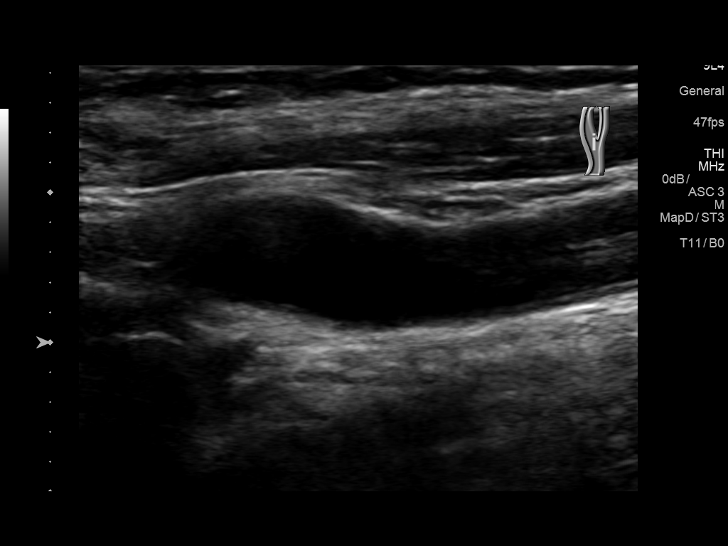
[im 18/68]
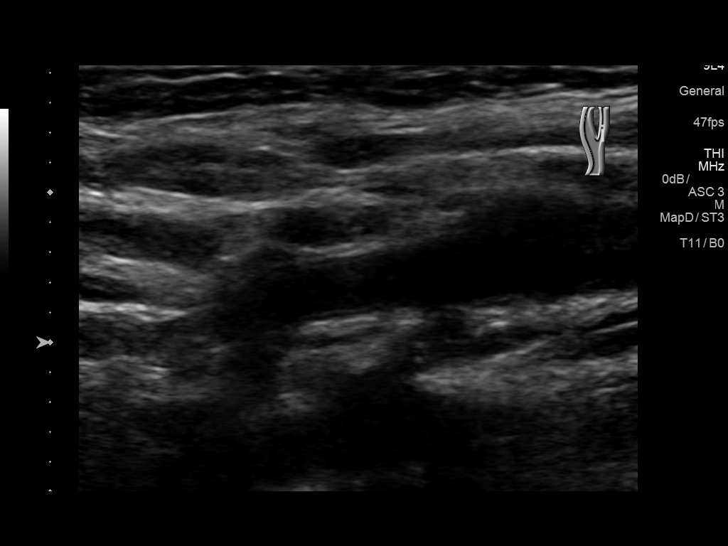
[im 24/68]
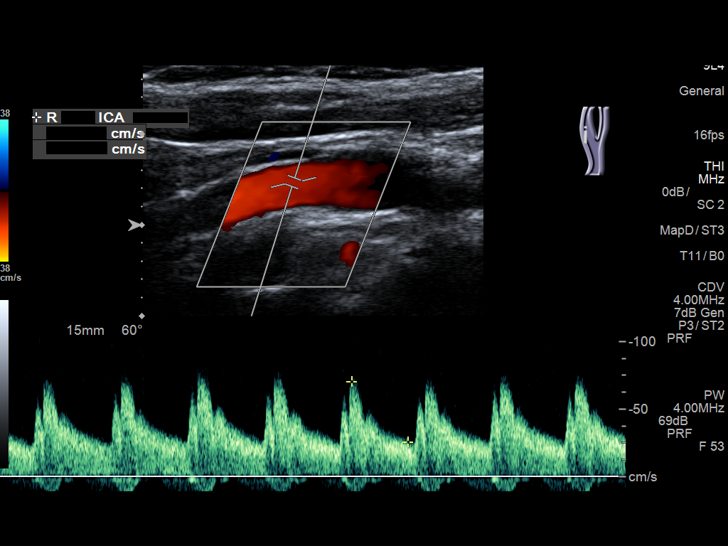
[im 30/68]
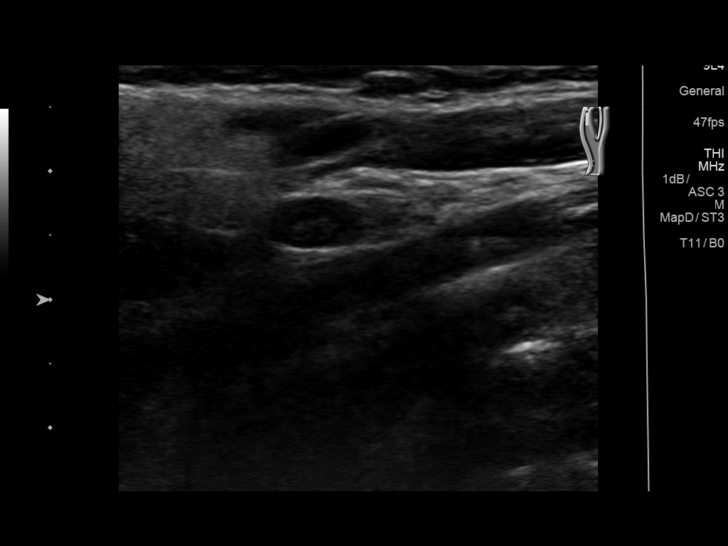
[im 35/68]
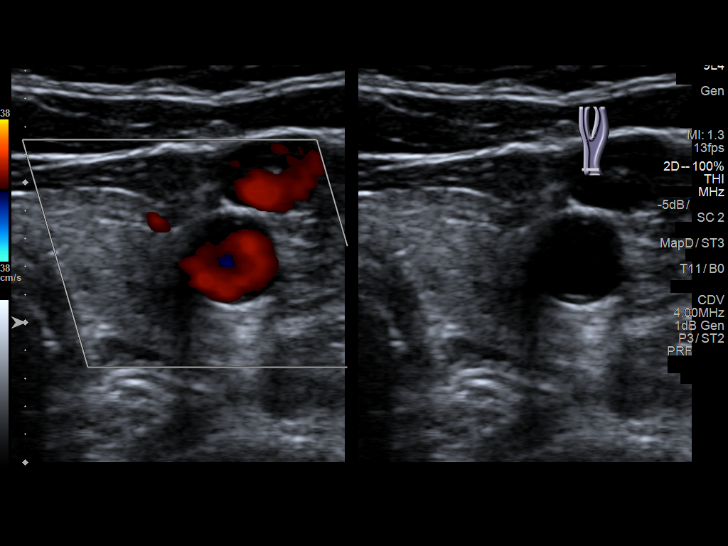
[im 38/68]
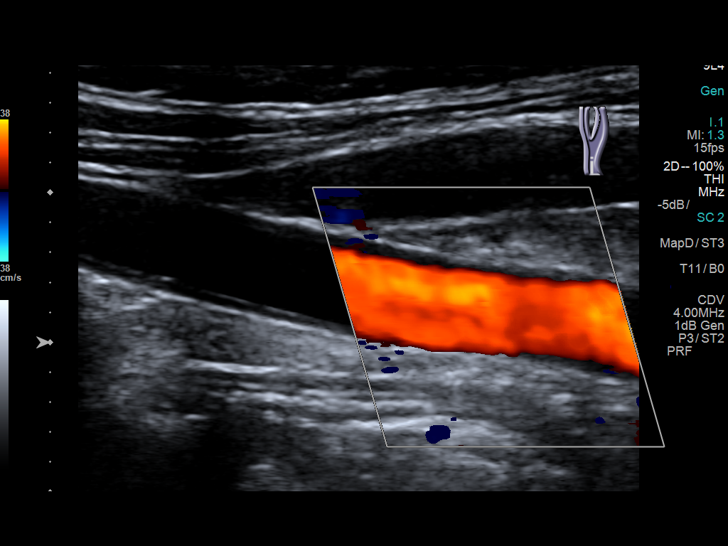
[im 44/68]
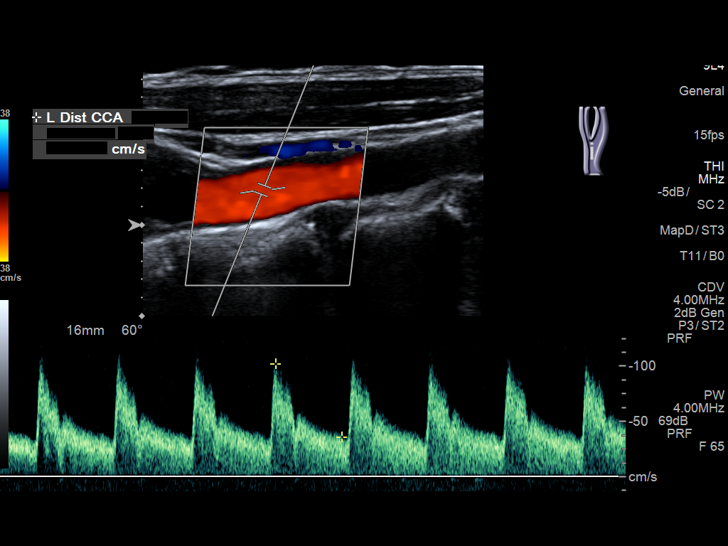
[im 50/68]
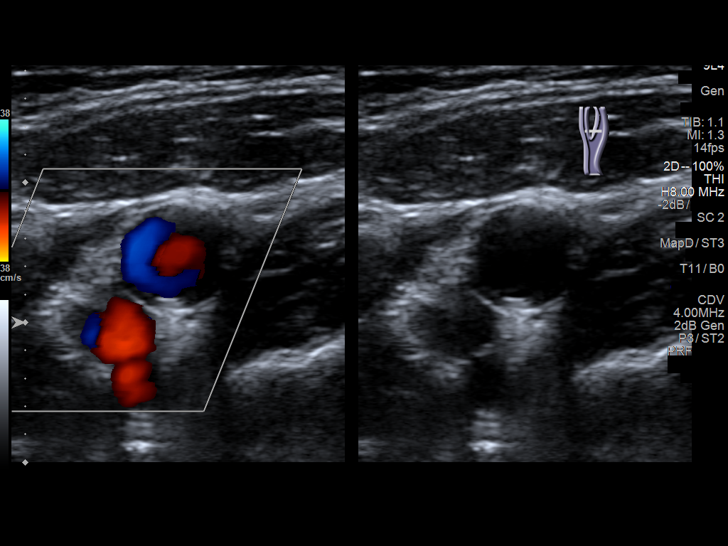
[im 56/68]
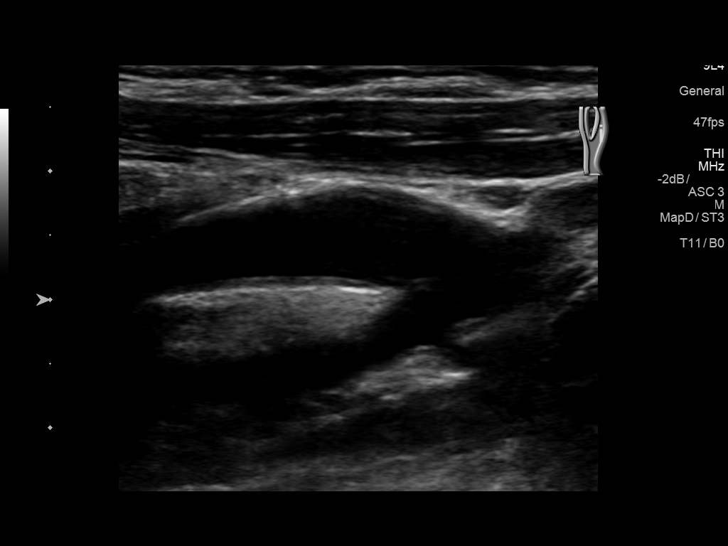
[im 62/68]
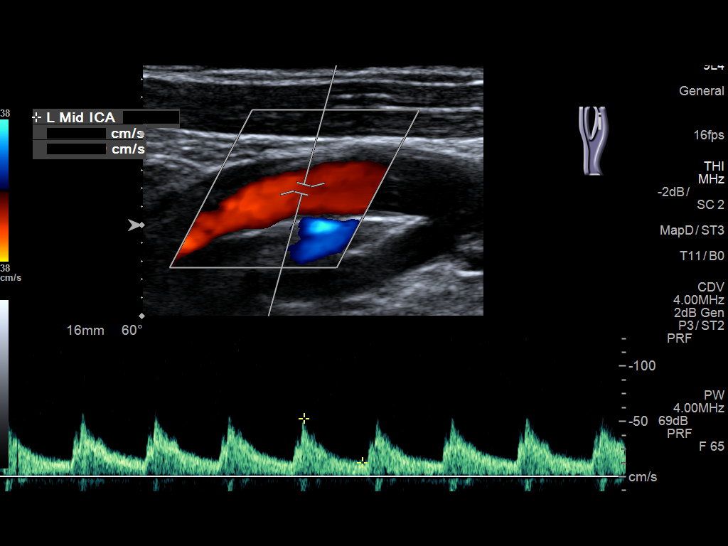
[im 68/68]
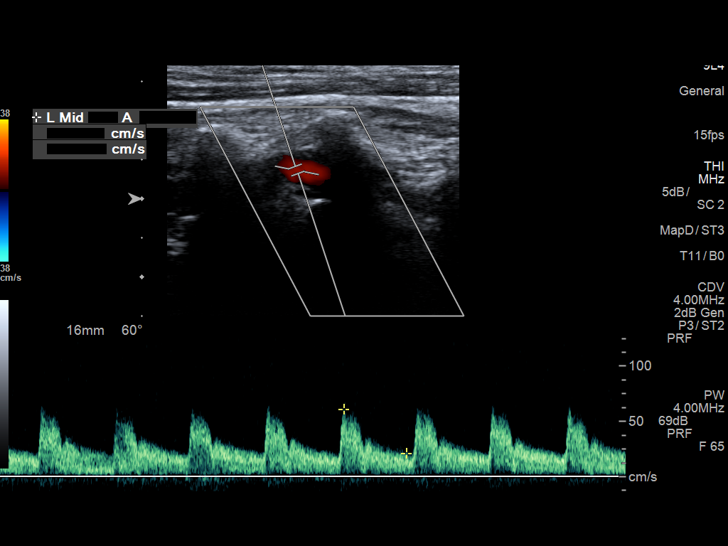

[13 of 24 positions shown; findings below may reference images not displayed]

FINDINGS: Criteria: Quantification of carotid stenosis is based on velocity
parameters that correlate the residual internal carotid diameter
with NASCET-based stenosis levels, using the diameter of the distal
internal carotid lumen as the denominator for stenosis measurement.

The following velocity measurements were obtained:

RIGHT

ICA:  91/36 cm/sec

CCA:  100/32 cm/sec

SYSTOLIC ICA/CCA RATIO:

DIASTOLIC ICA/CCA RATIO:

ECA:  72 cm/sec

LEFT

ICA:  93/35 cm/sec

CCA:  102/36 cm/sec

SYSTOLIC ICA/CCA RATIO:

DIASTOLIC ICA/CCA RATIO:

ECA:  86 cm/sec

RIGHT CAROTID ARTERY: There is a minimal amount of intimal
thickening within the right carotid bulb (representative images 10
and 11), extending to involve the origin and proximal aspect of the
right internal carotid artery (image 18), not resulting in elevated
peak systolic velocities within the interrogated course the right
internal carotid artery to suggest a hemodynamically significant
stenosis.

RIGHT VERTEBRAL ARTERY:  Antegrade Flow

LEFT CAROTID ARTERY: There is a minimal amount of intimal thickening
within the left carotid bulb (image 38), extending to involve the
origin and proximal aspects of the left internal carotid artery
(image 46), not resulting in elevated peak systolic velocities
within the interrogated course of the left internal carotid artery
to suggest a hemodynamically significant stenosis.

LEFT VERTEBRAL ARTERY:  Antegrade Flow
IMPRESSION: Minimal amount of bilateral intimal wall thickening, left
subjectively greater than right, not resulting in a hemodynamically
significant stenosis within either internal carotid artery.

## 2017-08-24 ENCOUNTER — Ambulatory Visit (INDEPENDENT_AMBULATORY_CARE_PROVIDER_SITE_OTHER): Payer: 59 | Admitting: Unknown Physician Specialty

## 2017-08-24 ENCOUNTER — Encounter: Payer: Self-pay | Admitting: Unknown Physician Specialty

## 2017-08-24 VITALS — BP 130/71 | HR 63 | Temp 97.9°F | Ht 65.3 in | Wt 156.6 lb

## 2017-08-24 DIAGNOSIS — Z23 Encounter for immunization: Secondary | ICD-10-CM | POA: Diagnosis not present

## 2017-08-24 DIAGNOSIS — Z0000 Encounter for general adult medical examination without abnormal findings: Secondary | ICD-10-CM | POA: Diagnosis not present

## 2017-08-24 NOTE — Patient Instructions (Addendum)
Influenza (Flu) Vaccine (Inactivated or Recombinant): What You Need to Know 1. Why get vaccinated? Influenza ("flu") is a contagious disease that spreads around the [place] every year, usually between October and May. Flu is caused by influenza viruses, and is spread mainly by coughing, sneezing, and close contact. Anyone can get flu. Flu strikes suddenly and can last several days. Symptoms vary by age, but can include:  fever/chills  sore throat  muscle aches  fatigue  cough  headache  runny or stuffy nose  Flu can also lead to pneumonia and blood infections, and cause diarrhea and seizures in children. If you have a medical condition, such as heart or lung disease, flu can make it worse. Flu is more dangerous for some people. Infants and young children, people 65 years of age and older, pregnant women, and people with certain health conditions or a weakened immune system are at greatest risk. Each year thousands of people in the [place] die from flu, and many more are hospitalized. Flu vaccine can:  keep you from getting flu,  make flu less severe if you do get it, and  keep you from spreading flu to your family and other people. 2. Inactivated and recombinant flu vaccines A dose of flu vaccine is recommended every flu season. Children 6 months through 8 years of age may need two doses during the same flu season. Everyone else needs only one dose each flu season. Some inactivated flu vaccines contain a very small amount of a mercury-based preservative called thimerosal. Studies have not shown thimerosal in vaccines to be harmful, but flu vaccines that do not contain thimerosal are available. There is no live flu virus in flu shots. They cannot cause the flu. There are many flu viruses, and they are always changing. Each year a new flu vaccine is made to protect against three or four viruses that are likely to cause disease in the upcoming flu season. But even when the  vaccine doesn't exactly match these viruses, it may still provide some protection. Flu vaccine cannot prevent:  flu that is caused by a virus not covered by the vaccine, or  illnesses that look like flu but are not.  It takes about 2 weeks for protection to develop after vaccination, and protection lasts through the flu season. 3. Some people should not get this vaccine Tell the person who is giving you the vaccine:  If you have any severe, life-threatening allergies. If you ever had a life-threatening allergic reaction after a dose of flu vaccine, or have a severe allergy to any part of this vaccine, you may be advised not to get vaccinated. Most, but not all, types of flu vaccine contain a small amount of egg protein.  If you ever had Guillain-Barr Syndrome (also called GBS). Some people with a history of GBS should not get this vaccine. This should be discussed with your doctor.  If you are not feeling well. It is usually okay to get flu vaccine when you have a mild illness, but you might be asked to come back when you feel better.  4. Risks of a vaccine reaction With any medicine, including vaccines, there is a chance of reactions. These are usually mild and go away on their own, but serious reactions are also possible. Most people who get a flu shot do not have any problems with it. Minor problems following a flu shot include:  soreness, redness, or swelling where the shot was given  hoarseness  sore,   red or itchy eyes  cough  fever  aches  headache  itching  fatigue  If these problems occur, they usually begin soon after the shot and last 1 or 2 days. More serious problems following a flu shot can include the following:  There may be a small increased risk of Guillain-Barre Syndrome (GBS) after inactivated flu vaccine. This risk has been estimated at 1 or 2 additional cases per million people vaccinated. This is much lower than the risk of severe complications from  flu, which can be prevented by flu vaccine.  Young children who get the flu shot along with pneumococcal vaccine (PCV13) and/or DTaP vaccine at the same time might be slightly more likely to have a seizure caused by fever. Ask your doctor for more information. Tell your doctor if a child who is getting flu vaccine has ever had a seizure.  Problems that could happen after any injected vaccine:  People sometimes faint after a medical procedure, including vaccination. Sitting or lying down for about 15 minutes can help prevent fainting, and injuries caused by a fall. Tell your doctor if you feel dizzy, or have vision changes or ringing in the ears.  Some people get severe pain in the shoulder and have difficulty moving the arm where a shot was given. This happens very rarely.  Any medication can cause a severe allergic reaction. Such reactions from a vaccine are very rare, estimated at about 1 in a million doses, and would happen within a few minutes to a few hours after the vaccination. As with any medicine, there is a very remote chance of a vaccine causing a serious injury or death. The safety of vaccines is always being monitored. For more information, visit: http://www.aguilar.org/ 5. What if there is a serious reaction? What should I look for? Look for anything that concerns you, such as signs of a severe allergic reaction, very high fever, or unusual behavior. Signs of a severe allergic reaction can include hives, swelling of the face and throat, difficulty breathing, a fast heartbeat, dizziness, and weakness. These would start a few minutes to a few hours after the vaccination. What should I do?  If you think it is a severe allergic reaction or other emergency that can't wait, call 9-1-1 and get the person to the nearest hospital. Otherwise, call your doctor.  Reactions should be reported to the Vaccine Adverse Event Reporting System (VAERS). Your doctor should file this report, or you  can do it yourself through the VAERS web site at www.vaers.SamedayNews.es, or by calling 6094730752. ? VAERS does not give medical advice. 6. The National Vaccine Injury Compensation Program The [employer] Vaccine Injury Compensation Program (VICP) is a federal program that was created to compensate people who may have been injured by certain vaccines. Persons who believe they may have been injured by a vaccine can learn about the program and about filing a claim by calling 458-267-6070 or visiting the [place] website at GoldCloset.com.ee. There is a time limit to file a claim for compensation. 7. How can I learn more?  Ask your healthcare provider. He or she can give you the vaccine package insert or suggest other sources of information.  Call your local or state health department.  Contact the Centers for Disease Control and Prevention (CDC): ? Call (540)164-9661 (1-800-CDC-INFO) or ? Visit CDC's website at https://gibson.com/ Vaccine Information Statement, Inactivated Influenza Vaccine (06/21/2014) This information is not intended to replace advice given to you by your health care provider. Make sure  you discuss any questions you have with your health care provider. Document Released: 08/26/2006 Document Revised: 07/22/2016 Document Reviewed: 07/22/2016 Elsevier Interactive Patient Education  2017 [employer]. Pneumococcal Conjugate Vaccine (PCV13) What You Need to Know 1. Why get vaccinated? Vaccination can protect both children and adults from pneumococcal disease. Pneumococcal disease is caused by bacteria that can spread from person to person through close contact. It can cause ear infections, and it can also lead to more serious infections of the:  Lungs (pneumonia),  Blood (bacteremia), and  Covering of the brain and spinal cord (meningitis).  Pneumococcal pneumonia is most common among adults. Pneumococcal meningitis can cause deafness and brain damage, and it kills  about 1 child in 10 who get it. Anyone can get pneumococcal disease, but children under 2 years of age and adults 65 years and older, people with certain medical conditions, and cigarette smokers are at the highest risk. Before there was a vaccine, the [place] saw:  more than 700 cases of meningitis,  about 13,000 blood infections,  about 5 million ear infections, and  about 200 deaths  in children under 5 each year from pneumococcal disease. Since vaccine became available, severe pneumococcal disease in these children has fallen by 88%. About 18,000 older adults die of pneumococcal disease each year in the [place]. Treatment of pneumococcal infections with penicillin and other drugs is not as effective as it used to be, because some strains of the disease have become resistant to these drugs. This makes prevention of the disease, through vaccination, even more important. 2. PCV13 vaccine Pneumococcal conjugate vaccine (called PCV13) protects against 13 types of pneumococcal bacteria. PCV13 is routinely given to children at 2, 4, 6, and 12-15 months of age. It is also recommended for children and adults 2 to 64 years of age with certain health conditions, and for all adults 65 years of age and older. Your doctor can give you details. 3. Some people should not get this vaccine Anyone who has ever had a life-threatening allergic reaction to a dose of this vaccine, to an earlier pneumococcal vaccine called PCV7, or to any vaccine containing diphtheria toxoid (for example, DTaP), should not get PCV13. Anyone with a severe allergy to any component of PCV13 should not get the vaccine. Tell your doctor if the person being vaccinated has any severe allergies. If the person scheduled for vaccination is not feeling well, your healthcare provider might decide to reschedule the shot on another day. 4. Risks of a vaccine reaction With any medicine, including vaccines, there is a chance of  reactions. These are usually mild and go away on their own, but serious reactions are also possible. Problems reported following PCV13 varied by age and dose in the series. The most common problems reported among children were:  About half became drowsy after the shot, had a temporary loss of appetite, or had redness or tenderness where the shot was given.  About 1 out of 3 had swelling where the shot was given.  About 1 out of 3 had a mild fever, and about 1 in 20 had a fever over 102.2F.  Up to about 8 out of 10 became fussy or irritable.  Adults have reported pain, redness, and swelling where the shot was given; also mild fever, fatigue, headache, chills, or muscle pain. Young children who get PCV13 along with inactivated flu vaccine at the same time may be at increased risk for seizures caused by fever. Ask your doctor for more   information. Problems that could happen after any vaccine:  People sometimes faint after a medical procedure, including vaccination. Sitting or lying down for about 15 minutes can help prevent fainting, and injuries caused by a fall. Tell your doctor if you feel dizzy, or have vision changes or ringing in the ears.  Some older children and adults get severe pain in the shoulder and have difficulty moving the arm where a shot was given. This happens very rarely.  Any medication can cause a severe allergic reaction. Such reactions from a vaccine are very rare, estimated at about 1 in a million doses, and would happen within a few minutes to a few hours after the vaccination. As with any medicine, there is a very small chance of a vaccine causing a serious injury or death. The safety of vaccines is always being monitored. For more information, visit: http://floyd.org/ 5. What if there is a serious reaction? What should I look for? Look for anything that concerns you, such as signs of a severe allergic reaction, very high fever, or unusual behavior. Signs of  a severe allergic reaction can include hives, swelling of the face and throat, difficulty breathing, a fast heartbeat, dizziness, and weakness-usually within a few minutes to a few hours after the vaccination. What should I do?  If you think it is a severe allergic reaction or other emergency that can't wait, call 9-1-1 or get the person to the nearest hospital. Otherwise, call your doctor.  Reactions should be reported to the Vaccine Adverse Event Reporting System (VAERS). Your doctor should file this report, or you can do it yourself through the VAERS web site at www.vaers.LAgents.no, or by calling 1-540-551-0661. ? VAERS does not give medical advice. 6. The National Vaccine Injury Compensation Program The [employer] Vaccine Injury Compensation Program (VICP) is a federal program that was created to compensate people who may have been injured by certain vaccines. Persons who believe they may have been injured by a vaccine can learn about the program and about filing a claim by calling 1-(380)192-6048 or visiting the VICP website at SpiritualWord.at. There is a time limit to file a claim for compensation. 7. How can I learn more?  Ask your healthcare provider. He or she can give you the vaccine package insert or suggest other sources of information.  Call your local or state health department.  Contact the Centers for Disease Control and Prevention (CDC): ? Call (580)721-0590 (1-800-CDC-INFO) or ? Visit CDC's website at PicCapture.uy Vaccine Information Statement, PCV13 Vaccine (09/19/2014) This information is not intended to replace advice given to you by your health care provider. Make sure you discuss any questions you have with your health care provider. Document Released: 08/29/2006 Document Revised: 07/22/2016 Document Reviewed: 07/22/2016 Elsevier Interactive Patient Education  2017 [employer]. Tdap Vaccine (Tetanus, Diphtheria and Pertussis): What You Need to  Know 1. Why get vaccinated? Tetanus, diphtheria and pertussis are very serious diseases. Tdap vaccine can protect [place] from these diseases. And, Tdap vaccine given to pregnant women can protect newborn babies against pertussis. TETANUS (Lockjaw) is rare in the [place] today. It causes painful muscle tightening and stiffness, usually all over the body.  It can lead to tightening of muscles in the head and neck so you can't open your mouth, swallow, or sometimes even breathe. Tetanus kills about 1 out of 10 people who are infected even after receiving the best medical care.  DIPHTHERIA is also rare in the [place] today. It can cause a thick  coating to form in the back of the throat.  It can lead to breathing problems, heart failure, paralysis, and death.  PERTUSSIS (Whooping Cough) causes severe coughing spells, which can cause difficulty breathing, vomiting and disturbed sleep.  It can also lead to weight loss, incontinence, and rib fractures. Up to 2 in 100 adolescents and 5 in 100 adults with pertussis are hospitalized or have complications, which could include pneumonia or death.  These diseases are caused by bacteria. Diphtheria and pertussis are spread from person to person through secretions from coughing or sneezing. Tetanus enters the body through cuts, scratches, or wounds. Before vaccines, as many as 200,000 cases of diphtheria, 200,000 cases of pertussis, and hundreds of cases of tetanus, were reported in the [place] each year. Since vaccination began, reports of cases for tetanus and diphtheria have dropped by about 99% and for pertussis by about 80%. 2. Tdap vaccine Tdap vaccine can protect adolescents and adults from tetanus, diphtheria, and pertussis. One dose of Tdap is routinely given at age 49 or 59. People who did not get Tdap at that age should get it as soon as possible. Tdap is especially important for healthcare professionals and anyone having close contact  with a baby younger than 12 months. Pregnant women should get a dose of Tdap during every pregnancy, to protect the newborn from pertussis. Infants are most at risk for severe, life-threatening complications from pertussis. Another vaccine, called Td, protects against tetanus and diphtheria, but not pertussis. A Td booster should be given every 10 years. Tdap may be given as one of these boosters if you have never gotten Tdap before. Tdap may also be given after a severe cut or burn to prevent tetanus infection. Your doctor or the person giving you the vaccine can give you more information. Tdap may safely be given at the same time as other vaccines. 3. Some people should not get this vaccine  A person who has ever had a life-threatening allergic reaction after a previous dose of any diphtheria, tetanus or pertussis containing vaccine, OR has a severe allergy to any part of this vaccine, should not get Tdap vaccine. Tell the person giving the vaccine about any severe allergies.  Anyone who had coma or long repeated seizures within 7 days after a childhood dose of DTP or DTaP, or a previous dose of Tdap, should not get Tdap, unless a cause other than the vaccine was found. They can still get Td.  Talk to your doctor if you: ? have seizures or another nervous system problem, ? had severe pain or swelling after any vaccine containing diphtheria, tetanus or pertussis, ? ever had a condition called Guillain-Barr Syndrome (GBS), ? aren't feeling well on the day the shot is scheduled. 4. Risks With any medicine, including vaccines, there is a chance of side effects. These are usually mild and go away on their own. Serious reactions are also possible but are rare. Most people who get Tdap vaccine do not have any problems with it. Mild problems following Tdap: (Did not interfere with activities)  Pain where the shot was given (about 3 in 4 adolescents or 2 in 3 adults)  Redness or swelling where the  shot was given (about 1 person in 5)  Mild fever of at least 100.45F (up to about 1 in 25 adolescents or 1 in 100 adults)  Headache (about 3 or 4 people in 10)  Tiredness (about 1 person in 3 or 4)  Nausea, vomiting, diarrhea, stomach  ache (up to 1 in 4 adolescents or 1 in 10 adults)  Chills, sore joints (about 1 person in 10)  Body aches (about 1 person in 3 or 4)  Rash, swollen glands (uncommon)  Moderate problems following Tdap: (Interfered with activities, but did not require medical attention)  Pain where the shot was given (up to 1 in 5 or 6)  Redness or swelling where the shot was given (up to about 1 in 16 adolescents or 1 in 12 adults)  Fever over 102F (about 1 in 100 adolescents or 1 in 250 adults)  Headache (about 1 in 7 adolescents or 1 in 10 adults)  Nausea, vomiting, diarrhea, stomach ache (up to 1 or 3 people in 100)  Swelling of the entire arm where the shot was given (up to about 1 in 500).  Severe problems following Tdap: (Unable to perform usual activities; required medical attention)  Swelling, severe pain, bleeding and redness in the arm where the shot was given (rare).  Problems that could happen after any vaccine:  People sometimes faint after a medical procedure, including vaccination. Sitting or lying down for about 15 minutes can help prevent fainting, and injuries caused by a fall. Tell your doctor if you feel dizzy, or have vision changes or ringing in the ears.  Some people get severe pain in the shoulder and have difficulty moving the arm where a shot was given. This happens very rarely.  Any medication can cause a severe allergic reaction. Such reactions from a vaccine are very rare, estimated at fewer than 1 in a million doses, and would happen within a few minutes to a few hours after the vaccination. As with any medicine, there is a very remote chance of a vaccine causing a serious injury or death. The safety of vaccines is always being  monitored. For more information, visit: http://floyd.org/ 5. What if there is a serious problem? What should I look for? Look for anything that concerns you, such as signs of a severe allergic reaction, very high fever, or unusual behavior. Signs of a severe allergic reaction can include hives, swelling of the face and throat, difficulty breathing, a fast heartbeat, dizziness, and weakness. These would usually start a few minutes to a few hours after the vaccination. What should I do?  If you think it is a severe allergic reaction or other emergency that can't wait, call 9-1-1 or get the person to the nearest hospital. Otherwise, call your doctor.  Afterward, the reaction should be reported to the Vaccine Adverse Event Reporting System (VAERS). Your doctor might file this report, or you can do it yourself through the VAERS web site at www.vaers.LAgents.no, or by calling 1-334-264-3635. ? VAERS does not give medical advice. 6. The National Vaccine Injury Compensation Program The [employer] Vaccine Injury Compensation Program (VICP) is a federal program that was created to compensate people who may have been injured by certain vaccines. Persons who believe they may have been injured by a vaccine can learn about the program and about filing a claim by calling 1-(801)496-0734 or visiting the VICP website at SpiritualWord.at. There is a time limit to file a claim for compensation. 7. How can I learn more?  Ask your doctor. He or she can give you the vaccine package insert or suggest other sources of information.  Call your local or state health department.  Contact the Centers for Disease Control and Prevention (CDC): ? Call (618) 743-0322 (1-800-CDC-INFO) or ? Visit CDC's website at PicCapture.uy CDC Tdap  Vaccine VIS (01/08/14) This information is not intended to replace advice given to you by your health care provider. Make sure you discuss any questions you have with  your health care provider. ---------------------------------------------------- Please do call to schedule your mammogram; the number to schedule one at either [facility] or [facility] is [phone]

## 2017-08-24 NOTE — Progress Notes (Signed)
BP 130/71   Pulse 63   Temp 97.9 F (36.6 C)   Ht 5' 5.3" (1.659 m)   Wt 156 lb 9.6 oz (71 kg)   LMP  (LMP Unknown)   SpO2 98%   BMI 25.82 kg/m    Subjective:    Patient ID: Kristen May, female    DOB: [birth year], 66 y.o.   MRN: [id number]  HPI: Kristen May is a 66 y.o. female  Chief Complaint  Patient presents with  . Annual Exam    Social History   Social History  . Marital status: Widowed    Spouse name: N/A  . Number of children: N/A  . Years of education: N/A   Occupational History  . Not on file.   Social History Main Topics  . Smoking status: Former Games developer  . Smokeless tobacco: Never Used  . Alcohol use 0.0 oz/week     Comment: on occasion  . Drug use: No  . Sexual activity: Yes   Other Topics Concern  . Not on file   Social History Narrative  . No narrative on file   Family History  Problem Relation Age of Onset  . Cancer Mother        stomach  . COPD Mother   . Heart disease Father   . Cancer Father        colon  . Diabetes Father   . Polycystic ovary syndrome Daughter   . Cancer Paternal Grandfather        rectal  . Depression Daughter   . Bipolar disorder Daughter    Past Medical History:  Diagnosis Date  . Anxiety   . Back pain   . Cervicalgia   . Depression   . Elevated vitamin B12 level   . Insomnia   . Lumbago   . Osteopenia    Past Surgical History:  Procedure Laterality Date  . CHOLECYSTECTOMY    . CHOLESTEATOMA EXCISION    . SKIN GRAFT      Relevant past medical, surgical, family and social history reviewed and updated as indicated. Interim medical history since our last visit reviewed. Allergies and medications reviewed and updated.  Review of Systems  Constitutional: Negative.   HENT: Negative.   Eyes: Negative.   Respiratory: Negative.   Cardiovascular: Negative.   Gastrointestinal: Negative.   Endocrine: Negative.   Genitourinary: Negative.   Musculoskeletal: Negative.   Skin: Negative.     Allergic/Immunologic: Negative.   Neurological: Negative.   Hematological: Negative.   Psychiatric/Behavioral: Negative.     Per HPI unless specifically indicated above     Objective:    BP 130/71   Pulse 63   Temp 97.9 F (36.6 C)   Ht 5' 5.3" (1.659 m)   Wt 156 lb 9.6 oz (71 kg)   LMP  (LMP Unknown)   SpO2 98%   BMI 25.82 kg/m   Wt Readings from Last 3 Encounters:  08/24/17 156 lb 9.6 oz (71 kg)  12/24/16 155 lb (70.3 kg)  08/23/16 146 lb 12.8 oz (66.6 kg)    Physical Exam  Constitutional: She is oriented to person, place, and time. She appears well-developed and well-nourished.  HENT:  Head: Normocephalic and atraumatic.  Eyes: Pupils are equal, round, and reactive to light. Right eye exhibits no discharge. Left eye exhibits no discharge. No scleral icterus.  Neck: Normal range of motion. Neck supple. Carotid bruit is not present. No thyromegaly present.  Cardiovascular: Normal rate, regular rhythm and normal heart  sounds.  Exam reveals no gallop and no friction rub.   No murmur heard. Pulmonary/Chest: Effort normal and breath sounds normal. No respiratory distress. She has no wheezes. She has no rales.  Abdominal: Soft. Bowel sounds are normal. There is no tenderness. There is no rebound.  Genitourinary: No breast swelling, tenderness or discharge.  Musculoskeletal: Normal range of motion.  Lymphadenopathy:    She has no cervical adenopathy.  Neurological: She is alert and oriented to person, place, and time.  Skin: Skin is warm, dry and intact. No rash noted.  Psychiatric: She has a normal mood and affect. Her speech is normal and behavior is normal. Judgment and thought content normal. Cognition and memory are normal.      Assessment & Plan:   Problem List Items Addressed This Visit    None    Visit Diagnoses    Need for influenza vaccination    -  Primary   Relevant Orders   Flu vaccine HIGH DOSE PF (Completed)   Need for diphtheria-tetanus-pertussis  (Tdap) vaccine       Relevant Orders   Tdap vaccine greater than or equal to 7yo IM (Completed)   Need for pneumococcal vaccination       Relevant Orders   Pneumococcal conjugate vaccine 13-valent IM (Completed)   Annual physical exam       Relevant Orders   CBC with Differential/Platelet   Comprehensive metabolic panel   Lipid Panel w/o Chol/HDL Ratio   TSH   VITAMIN D 25 Hydroxy (Vit-D Deficiency, Fractures)   MM DIGITAL SCREENING BILATERAL       Follow up plan: Return in about 1 year (around 08/24/2018).

## 2017-08-25 LAB — CBC WITH DIFFERENTIAL/PLATELET
BASOS ABS: 0 10*3/uL (ref 0.0–0.2)
Basos: 1 %
EOS (ABSOLUTE): 0.4 10*3/uL (ref 0.0–0.4)
Eos: 7 %
HEMOGLOBIN: 13.4 g/dL (ref 11.1–15.9)
Hematocrit: 38.7 % (ref 34.0–46.6)
Immature Grans (Abs): 0 10*3/uL (ref 0.0–0.1)
Immature Granulocytes: 0 %
LYMPHS ABS: 2.5 10*3/uL (ref 0.7–3.1)
Lymphs: 40 %
MCH: 33.5 pg — AB (ref 26.6–33.0)
MCHC: 34.6 g/dL (ref 31.5–35.7)
MCV: 97 fL (ref 79–97)
MONOCYTES: 8 %
MONOS ABS: 0.5 10*3/uL (ref 0.1–0.9)
NEUTROS ABS: 2.8 10*3/uL (ref 1.4–7.0)
Neutrophils: 44 %
Platelets: 265 10*3/uL (ref 150–379)
RBC: 4 x10E6/uL (ref 3.77–5.28)
RDW: 13 % (ref 12.3–15.4)
WBC: 6.3 10*3/uL (ref 3.4–10.8)

## 2017-08-25 LAB — COMPREHENSIVE METABOLIC PANEL
A/G RATIO: 2.1 (ref 1.2–2.2)
ALBUMIN: 4.5 g/dL (ref 3.6–4.8)
ALK PHOS: 57 IU/L (ref 39–117)
ALT: 24 IU/L (ref 0–32)
AST: 19 IU/L (ref 0–40)
BILIRUBIN TOTAL: 0.5 mg/dL (ref 0.0–1.2)
BUN / CREAT RATIO: 16 (ref 12–28)
BUN: 13 mg/dL (ref 8–27)
CHLORIDE: 105 mmol/L (ref 96–106)
CO2: 23 mmol/L (ref 20–29)
Calcium: 9.5 mg/dL (ref 8.7–10.3)
Creatinine, Ser: 0.81 mg/dL (ref 0.57–1.00)
GFR calc non Af Amer: 76 mL/min/{1.73_m2} (ref >59)
GFR, EST AFRICAN AMERICAN: 88 mL/min/{1.73_m2} (ref >59)
GLUCOSE: 84 mg/dL (ref 65–99)
Globulin, Total: 2.1 g/dL (ref 1.5–4.5)
POTASSIUM: 4.5 mmol/L (ref 3.5–5.2)
Sodium: 143 mmol/L (ref 134–144)
TOTAL PROTEIN: 6.6 g/dL (ref 6.0–8.5)

## 2017-08-25 LAB — LIPID PANEL W/O CHOL/HDL RATIO
CHOLESTEROL TOTAL: 240 mg/dL — AB (ref 100–199)
HDL: 91 mg/dL (ref >39)
LDL Calculated: 137 mg/dL — ABNORMAL HIGH (ref 0–99)
Triglycerides: 61 mg/dL (ref 0–149)
VLDL CHOLESTEROL CAL: 12 mg/dL (ref 5–40)

## 2017-08-25 LAB — VITAMIN D 25 HYDROXY (VIT D DEFICIENCY, FRACTURES): VIT D 25 HYDROXY: 28.7 ng/mL — AB (ref 30.0–100.0)

## 2017-08-25 LAB — TSH: TSH: 1.37 u[IU]/mL (ref 0.450–4.500)

## 2017-09-01 ENCOUNTER — Telehealth: Payer: Self-pay | Admitting: Unknown Physician Specialty

## 2017-09-01 NOTE — Telephone Encounter (Signed)
Copy mailed.

## 2017-09-01 NOTE — Telephone Encounter (Signed)
Patient requesting a copy of her results from 08/24/2017 mailed to her. She did receive a call regarding results but would also like for results to mailed as well.   Thank you

## 2018-08-28 ENCOUNTER — Encounter: Payer: Self-pay | Admitting: Nurse Practitioner

## 2018-08-28 ENCOUNTER — Encounter: Payer: 59 | Admitting: Unknown Physician Specialty

## 2018-12-18 ENCOUNTER — Encounter: Payer: Self-pay | Admitting: Nurse Practitioner

## 2018-12-18 ENCOUNTER — Other Ambulatory Visit: Payer: Self-pay

## 2018-12-18 ENCOUNTER — Ambulatory Visit: Payer: Managed Care, Other (non HMO) | Admitting: Nurse Practitioner

## 2018-12-18 DIAGNOSIS — J40 Bronchitis, not specified as acute or chronic: Secondary | ICD-10-CM

## 2018-12-18 MED ORDER — BENZONATATE 200 MG PO CAPS: 200.0000 mg | ORAL_CAPSULE | Freq: Two times a day (BID) | ORAL | 0 refills | Status: DC | PRN

## 2018-12-18 MED ORDER — AZITHROMYCIN 250 MG PO TABS: ORAL_TABLET | ORAL | 0 refills | Status: DC

## 2018-12-18 NOTE — Patient Instructions (Signed)

## 2018-12-18 NOTE — Progress Notes (Signed)
BP 120/76 (BP Location: Left Arm, Patient Position: Sitting)   Pulse 65   Temp 98.1 F (36.7 C) (Oral)   Ht 5' 5.5" (1.664 m)   Wt 158 lb (71.7 kg)   LMP  (LMP Unknown)   SpO2 98%   BMI 25.89 kg/m    Subjective:    Patient ID: Kristen May, female    DOB: 01/05/1951, 68 y.o.   MRN: [id number]  HPI: Kristen LacksSherry Jayne is a 68 y.o. female  Chief Complaint  Patient presents with  . Cough    since Jan 2nd  . Nasal Congestion   UPPER RESPIRATORY TRACT INFECTION Started on January 2nd with cold symptoms (cough and runny nose).   Worst symptom: cough Fever: no Cough: yes Shortness of breath: present with the cough on occasion Wheezing: no Chest pain: yes, with cough Chest tightness: yes Chest congestion: yes Nasal congestion: no Runny nose: no Post nasal drip: no Sneezing: no Sore throat: no Swollen glands: no Sinus pressure: no Headache: no Face pain: no Toothache: no Ear pain: none Ear pressure: none Eyes red/itching:no Eye drainage/crusting: no  Vomiting: no Rash: no Fatigue: yes Sick contacts: yes Strep contacts: no  Context: fluctuating Recurrent sinusitis: no Relief with OTC cold/cough medications: no  Treatments attempted: mucinex and anti-histamine   Relevant past medical, surgical, family and social history reviewed and updated as indicated. Interim medical history since our last visit reviewed. Allergies and medications reviewed and updated.  Review of Systems  Constitutional: Positive for fatigue. Negative for activity change, appetite change and fever.  HENT: Positive for congestion, postnasal drip and rhinorrhea. Negative for ear discharge, ear pain, facial swelling, sinus pressure, sinus pain, sneezing, sore throat and voice change.   Eyes: Negative for pain and visual disturbance.  Respiratory: Positive for cough and chest tightness. Negative for shortness of breath and wheezing.   Cardiovascular: Negative for chest pain, palpitations and leg swelling.   Gastrointestinal: Negative for abdominal distention, abdominal pain, constipation, diarrhea, nausea and vomiting.  Endocrine: Negative.   Musculoskeletal: Negative for myalgias.  Neurological: Negative for dizziness, numbness and headaches.  Psychiatric/Behavioral: Negative.     Per HPI unless specifically indicated above     Objective:    BP 120/76 (BP Location: Left Arm, Patient Position: Sitting)   Pulse 65   Temp 98.1 F (36.7 C) (Oral)   Ht 5' 5.5" (1.664 m)   Wt 158 lb (71.7 kg)   LMP  (LMP Unknown)   SpO2 98%   BMI 25.89 kg/m   Wt Readings from Last 3 Encounters:  12/18/18 158 lb (71.7 kg)  08/24/17 156 lb 9.6 oz (71 kg)  12/24/16 155 lb (70.3 kg)    Physical Exam Vitals signs and nursing note reviewed.  Constitutional:      General: She is awake.     Appearance: She is well-developed. She is ill-appearing.  HENT:     Head: Normocephalic.     Right Ear: Hearing, ear canal and external ear normal. No drainage. A middle ear effusion is present.     Left Ear: Hearing, ear canal and external ear normal. No drainage. A middle ear effusion is present.     Nose: Mucosal edema and rhinorrhea present. Rhinorrhea is clear.     Right Sinus: No maxillary sinus tenderness or frontal sinus tenderness.     Left Sinus: No maxillary sinus tenderness or frontal sinus tenderness.     Mouth/Throat:     Mouth: Mucous membranes are moist.  Pharynx: Posterior oropharyngeal erythema (mild with cobblestoning) present. No pharyngeal swelling or oropharyngeal exudate.  Eyes:     General: Lids are normal.        Right eye: No discharge.        Left eye: No discharge.     Conjunctiva/sclera: Conjunctivae normal.     Pupils: Pupils are equal, round, and reactive to light.  Neck:     Musculoskeletal: Normal range of motion and neck supple.     Thyroid: No thyromegaly.     Vascular: No carotid bruit or JVD.  Cardiovascular:     Rate and Rhythm: Normal rate and regular rhythm.      Heart sounds: Normal heart sounds. No murmur. No gallop.   Pulmonary:     Effort: Pulmonary effort is normal.     Breath sounds: Normal breath sounds.     Comments: Clear throughout. Abdominal:     General: Bowel sounds are normal.     Palpations: Abdomen is soft. There is no hepatomegaly or splenomegaly.  Musculoskeletal:     Right lower leg: No edema.     Left lower leg: No edema.  Lymphadenopathy:     Cervical: No cervical adenopathy.  Skin:    General: Skin is warm and dry.  Neurological:     Mental Status: She is alert and oriented to person, place, and time.  Psychiatric:        Attention and Perception: Attention normal.        Mood and Affect: Mood normal.        Behavior: Behavior normal. Behavior is cooperative.        Thought Content: Thought content normal.        Judgment: Judgment normal.     Results for orders placed or performed in visit on 08/24/17  CBC with Differential/Platelet  Result Value Ref Range   WBC 6.3 3.4 - 10.8 x10E3/uL   RBC 4.00 3.77 - 5.28 x10E6/uL   Hemoglobin 13.4 11.1 - 15.9 g/dL   Hematocrit 16.138.7 09.634.0 - 46.6 %   MCV 97 79 - 97 fL   MCH 33.5 (H) 26.6 - 33.0 pg   MCHC 34.6 31.5 - 35.7 g/dL   RDW 04.513.0 40.912.3 - 81.115.4 %   Platelets 265 150 - 379 x10E3/uL   Neutrophils 44 Not Estab. %   Lymphs 40 Not Estab. %   Monocytes 8 Not Estab. %   Eos 7 Not Estab. %   Basos 1 Not Estab. %   Neutrophils Absolute 2.8 1.4 - 7.0 x10E3/uL   Lymphocytes Absolute 2.5 0.7 - 3.1 x10E3/uL   Monocytes Absolute 0.5 0.1 - 0.9 x10E3/uL   EOS (ABSOLUTE) 0.4 0.0 - 0.4 x10E3/uL   Basophils Absolute 0.0 0.0 - 0.2 x10E3/uL   Immature Granulocytes 0 Not Estab. %   Immature Grans (Abs) 0.0 0.0 - 0.1 x10E3/uL  Comprehensive metabolic panel  Result Value Ref Range   Glucose 84 65 - 99 mg/dL   BUN 13 8 - 27 mg/dL   Creatinine, Ser 9.140.81 0.57 - 1.00 mg/dL   GFR calc non Af Amer 76 >59 mL/min/1.73   GFR calc Af Amer 88 >59 mL/min/1.73   BUN/Creatinine Ratio 16 12 - 28     Sodium 143 134 - 144 mmol/L   Potassium 4.5 3.5 - 5.2 mmol/L   Chloride 105 96 - 106 mmol/L   CO2 23 20 - 29 mmol/L   Calcium 9.5 8.7 - 10.3 mg/dL   Total Protein 6.6 6.0 -  8.5 g/dL   Albumin 4.5 3.6 - 4.8 g/dL   Globulin, Total 2.1 1.5 - 4.5 g/dL   Albumin/Globulin Ratio 2.1 1.2 - 2.2   Bilirubin Total 0.5 0.0 - 1.2 mg/dL   Alkaline Phosphatase 57 39 - 117 IU/L   AST 19 0 - 40 IU/L   ALT 24 0 - 32 IU/L  Lipid Panel w/o Chol/HDL Ratio  Result Value Ref Range   Cholesterol, Total 240 (H) 100 - 199 mg/dL   Triglycerides 61 0 - 149 mg/dL   HDL 91 >16 mg/dL   VLDL Cholesterol Cal 12 5 - 40 mg/dL   LDL Calculated 109 (H) 0 - 99 mg/dL  TSH  Result Value Ref Range   TSH 1.370 0.450 - 4.500 uIU/mL  VITAMIN D 25 Hydroxy (Vit-D Deficiency, Fractures)  Result Value Ref Range   Vit D, 25-Hydroxy 28.7 (L) 30.0 - 100.0 ng/mL      Assessment & Plan:   Problem List Items Addressed This Visit      Respiratory   Bronchitis    Acute, ongoing cough since 11/16/2018.  Has tried OTC medications with no improvement.  Script for zpack and [facility] sent to pharmacy.  Recommend increased hydration and rest.  May use OTC Claritin or Zyrtec for runny nose and Mucinex for cough.  Return for worsening or continued symptoms.  Refuses CXR today.          Follow up plan: Return if symptoms worsen or fail to improve.

## 2018-12-18 NOTE — Assessment & Plan Note (Signed)
Acute, ongoing cough since 11/16/2018.  Has tried OTC medications with no improvement.  Script for zpack and [facility] sent to pharmacy.  Recommend increased hydration and rest.  May use OTC Claritin or Zyrtec for runny nose and Mucinex for cough.  Return for worsening or continued symptoms.  Refuses CXR today.

## 2019-02-13 ENCOUNTER — Other Ambulatory Visit: Payer: Self-pay | Admitting: Nurse Practitioner

## 2019-02-13 MED ORDER — LORAZEPAM 1 MG PO TABS: 1.0000 mg | ORAL_TABLET | Freq: Every evening | ORAL | 0 refills | Status: DC | PRN

## 2019-02-14 ENCOUNTER — Ambulatory Visit: Payer: Self-pay | Admitting: Nurse Practitioner

## 2019-02-28 ENCOUNTER — Other Ambulatory Visit: Payer: Self-pay

## 2019-02-28 ENCOUNTER — Ambulatory Visit: Payer: Managed Care, Other (non HMO) | Admitting: Nurse Practitioner

## 2019-08-06 ENCOUNTER — Other Ambulatory Visit: Payer: Self-pay

## 2019-08-06 ENCOUNTER — Encounter: Payer: Self-pay | Admitting: Nurse Practitioner

## 2019-08-06 ENCOUNTER — Ambulatory Visit
Admission: RE | Admit: 2019-08-06 | Discharge: 2019-08-06 | Disposition: A | Discharge status: Home / Self Care | Payer: Managed Care, Other (non HMO) | Source: Ambulatory Visit | Attending: Nurse Practitioner | Admitting: Nurse Practitioner

## 2019-08-06 ENCOUNTER — Ambulatory Visit: Payer: Managed Care, Other (non HMO) | Admitting: Nurse Practitioner

## 2019-08-06 ENCOUNTER — Ambulatory Visit
Admission: RE | Admit: 2019-08-06 | Discharge: 2019-08-06 | Disposition: A | Discharge status: Home / Self Care | Payer: Managed Care, Other (non HMO) | Attending: Nurse Practitioner | Admitting: Nurse Practitioner

## 2019-08-06 VITALS — BP 136/78 | HR 56 | Temp 98.4°F

## 2019-08-06 DIAGNOSIS — M545 Low back pain, unspecified: Secondary | ICD-10-CM

## 2019-08-06 DIAGNOSIS — M5441 Lumbago with sciatica, right side: Secondary | ICD-10-CM

## 2019-08-06 MED ORDER — PREDNISONE 10 MG PO TABS: ORAL_TABLET | ORAL | 0 refills | Status: DC

## 2019-08-06 NOTE — Patient Instructions (Addendum)
[address], [place], [place] [address] [phone]   Acute Back Pain, Adult Acute back pain is sudden and usually short-lived. It is often caused by an injury to the muscles and tissues in the back. The injury may result from:  A muscle or ligament getting overstretched or torn (strained). Ligaments are tissues that connect bones to each other. Lifting something improperly can cause a back strain.  Wear and tear (degeneration) of the spinal disks. Spinal disks are circular tissue that provides cushioning between the bones of the spine (vertebrae).  Twisting motions, such as while playing sports or doing yard work.  A hit to the back.  Arthritis. You may have a physical exam, lab tests, and imaging tests to find the cause of your pain. Acute back pain usually goes away with rest and home care. Follow these instructions at home: Managing pain, stiffness, and swelling  Take over-the-counter and prescription medicines only as told by your health care provider.  Your health care provider may recommend applying ice during the first 24-48 hours after your pain starts. To do this: ? Put ice in a plastic bag. ? Place a towel between your skin and the bag. ? Leave the ice on for 20 minutes, 2-3 times a day.  If directed, apply heat to the affected area as often as told by your health care provider. Use the heat source that your health care provider recommends, such as a moist heat pack or a heating pad. ? Place a towel between your skin and the heat source. ? Leave the heat on for 20-30 minutes. ? Remove the heat if your skin turns bright red. This is especially important if you are unable to feel pain, heat, or cold. You have a greater risk of getting burned. Activity   Do not stay in bed. Staying in bed for more than 1-2 days can delay your recovery.  Sit up and stand up straight. Avoid leaning forward when you sit, or hunching over when you stand. ? If you work at a desk,  sit close to it so you do not need to lean over. Keep your chin tucked in. Keep your neck drawn back, and keep your elbows bent at a right angle. Your arms should look like the letter "L." ? Sit high and close to the steering wheel when you drive. Add lower back (lumbar) support to your car seat, if needed.  Take short walks on even surfaces as soon as you are able. Try to increase the length of time you walk each day.  Do not sit, drive, or stand in one place for more than 30 minutes at a time. Sitting or standing for long periods of time can put stress on your back.  Do not drive or use heavy machinery while taking prescription pain medicine.  Use proper lifting techniques. When you bend and lift, use positions that put less stress on your back: ? Bend your knees. ? Keep the load close to your body. ? Avoid twisting.  Exercise regularly as told by your health care provider. Exercising helps your back heal faster and helps prevent back injuries by keeping muscles strong and flexible.  Work with a physical therapist to make a safe exercise program, as recommended by your health care provider. Do any exercises as told by your physical therapist. Lifestyle  Maintain a healthy weight. Extra weight puts stress on your back and makes it difficult to have good posture.  Avoid activities or situations   that make you feel anxious or stressed. Stress and anxiety increase muscle tension and can make back pain worse. Learn ways to manage anxiety and stress, such as through exercise. General instructions  Sleep on a firm mattress in a comfortable position. Try lying on your side with your knees slightly bent. If you lie on your back, put a pillow under your knees.  Follow your treatment plan as told by your health care provider. This may include: ? Cognitive or behavioral therapy. ? Acupuncture or massage therapy. ? Meditation or yoga. Contact a health care provider if:  You have pain that is not  relieved with rest or medicine.  You have increasing pain going down into your legs or buttocks.  Your pain does not improve after 2 weeks.  You have pain at night.  You lose weight without trying.  You have a fever or chills. Get help right away if:  You develop new bowel or bladder control problems.  You have unusual weakness or numbness in your arms or legs.  You develop nausea or vomiting.  You develop abdominal pain.  You feel faint. Summary  Acute back pain is sudden and usually short-lived.  Use proper lifting techniques. When you bend and lift, use positions that put less stress on your back.  Take over-the-counter and prescription medicines and apply heat or ice as directed by your health care provider. This information is not intended to replace advice given to you by your health care provider. Make sure you discuss any questions you have with your health care provider. Document Released: 11/01/2005 Document Revised: 02/20/2019 Document Reviewed: 06/15/2017 [employer] Patient Education  2020 [employer]  

## 2019-08-06 NOTE — Progress Notes (Signed)
BP 136/78 (BP Location: Left Arm, Patient Position: Sitting)    Pulse (!) 56    Temp 98.4 F (36.9 C) (Oral)    LMP  (LMP Unknown)    SpO2 94%    Subjective:    Patient ID: Kristen May, female    DOB: [birth year], 68 y.o.   MRN: [id number]  HPI: Kristen May is a 68 y.o. female  Chief Complaint  Patient presents with   Back Pain    pt states she has had right sided low back pain that runs into her hip and down her leg, States this started yesterday    BACK PAIN Is going to [place] next week to golf.  Her back pain started yesterday, in right lower back and hip with radiation down right leg above knee and then yesterday evening became worse.  This morning could barely stand up and walk.  Had not been doing anything the day before pain that she can recall.  Took some ASA this morning and that helped today.  Has history of [facility] imaging lumbar in 2007 that showed minimal disc space narrowing L4-L5 and mild facet sclerosis of the lower spine.  Last bone density in 2014 did show osteopenia, not currently taking Vitamin D3 or calcium daily.   lumbar spine Duration: days Mechanism of injury: unknown Location: Right and low back Onset: gradual Severity: 8/10 Quality: burning and shooting Frequency: intermittent Radiation: R leg above the knee Aggravating factors: movement, walking and bending Alleviating factors: ASA and rest Status: fluctuating Treatments attempted: rest and ASA  Relief with NSAIDs?: No NSAIDs Taken Nighttime pain:  no Paresthesias / decreased sensation:  no Bowel / bladder incontinence:  no Fevers:  no Dysuria / urinary frequency:  no  Relevant past medical, surgical, family and social history reviewed and updated as indicated. Interim medical history since our last visit reviewed. Allergies and medications reviewed and updated.  Review of Systems  Constitutional: Negative for activity change, appetite change, diaphoresis, fatigue and fever.  Respiratory: Negative  for cough, chest tightness and shortness of breath.   Cardiovascular: Negative for chest pain, palpitations and leg swelling.  Gastrointestinal: Negative for abdominal distention, abdominal pain, constipation, diarrhea, nausea and vomiting.  Musculoskeletal: Positive for back pain.  Neurological: Negative for dizziness, weakness and numbness.  Psychiatric/Behavioral: Negative.     Per HPI unless specifically indicated above     Objective:    BP 136/78 (BP Location: Left Arm, Patient Position: Sitting)    Pulse (!) 56    Temp 98.4 F (36.9 C) (Oral)    LMP  (LMP Unknown)    SpO2 94%   Wt Readings from Last 3 Encounters:  12/18/18 158 lb (71.7 kg)  08/24/17 156 lb 9.6 oz (71 kg)  12/24/16 155 lb (70.3 kg)    Physical Exam Vitals signs and nursing note reviewed.  Constitutional:      General: She is awake. She is not in acute distress.    Appearance: She is well-developed. She is not ill-appearing.  HENT:     Head: Normocephalic.     Right Ear: Hearing normal.     Left Ear: Hearing normal.  Eyes:     General: Lids are normal.        Right eye: No discharge.        Left eye: No discharge.     Conjunctiva/sclera: Conjunctivae normal.     Pupils: Pupils are equal, round, and reactive to light.  Neck:     Musculoskeletal:  Normal range of motion and neck supple.  Cardiovascular:     Rate and Rhythm: Normal rate and regular rhythm.     Heart sounds: Normal heart sounds. No murmur. No gallop.   Pulmonary:     Effort: Pulmonary effort is normal. No accessory muscle usage or respiratory distress.     Breath sounds: Normal breath sounds.  Abdominal:     General: Bowel sounds are normal.     Palpations: Abdomen is soft.  Musculoskeletal:     Lumbar back: She exhibits decreased range of motion and pain. She exhibits no tenderness, no bony tenderness, no swelling, no edema, no laceration and no spasm.     Right lower leg: No edema.     Left lower leg: No edema.  Skin:    General:  Skin is warm and dry.  Neurological:     Mental Status: She is alert and oriented to person, place, and time.  Psychiatric:        Attention and Perception: Attention normal.        Mood and Affect: Mood normal.        Behavior: Behavior normal. Behavior is cooperative.        Thought Content: Thought content normal.        Judgment: Judgment normal.   Back Exam:    Inspection:  Normal spinal curvature.  No deformity, ecchymosis, erythema, or lesions   Curvature: Normal   Deformity: none  Ecchymosis: none  Erythema: none  Lesions: none    Palpation:     Midline spinal tenderness: none   Paralumbar tenderness: yes Right     Parathoracic tenderness: none     Buttocks tenderness: none     Range of Motion:      Flexion: Fingers to Ground     Extension:Decreased     Lateral bending:Decreased    Rotation:Normal    Neuro Exam:Lower extremity DTRs normal & symmetric.  Strength and sensation intact.     Patellar DTRs: Normal    Special Tests:      Straight leg raise: mild discomfort on coming down noted     Crossed straight leg raise:negative     Results for orders placed or performed in visit on 08/24/17  CBC with Differential/Platelet  Result Value Ref Range   WBC 6.3 3.4 - 10.8 x10E3/uL   RBC 4.00 3.77 - 5.28 x10E6/uL   Hemoglobin 13.4 11.1 - 15.9 g/dL   Hematocrit 38.7 34.0 - 46.6 %   MCV 97 79 - 97 fL   MCH 33.5 (H) 26.6 - 33.0 pg   MCHC 34.6 31.5 - 35.7 g/dL   RDW 13.0 12.3 - 15.4 %   Platelets 265 150 - 379 x10E3/uL   Neutrophils 44 Not Estab. %   Lymphs 40 Not Estab. %   Monocytes 8 Not Estab. %   Eos 7 Not Estab. %   Basos 1 Not Estab. %   Neutrophils Absolute 2.8 1.4 - 7.0 x10E3/uL   Lymphocytes Absolute 2.5 0.7 - 3.1 x10E3/uL   Monocytes Absolute 0.5 0.1 - 0.9 x10E3/uL   EOS (ABSOLUTE) 0.4 0.0 - 0.4 x10E3/uL   Basophils Absolute 0.0 0.0 - 0.2 x10E3/uL   Immature Granulocytes 0 Not Estab. %   Immature Grans (Abs) 0.0 0.0 - 0.1 x10E3/uL  Comprehensive  metabolic panel  Result Value Ref Range   Glucose 84 65 - 99 mg/dL   BUN 13 8 - 27 mg/dL   Creatinine, Ser 0.81 0.57 - 1.00 mg/dL  GFR calc non Af Amer 76 >59 mL/min/1.73   GFR calc Af Amer 88 >59 mL/min/1.73   BUN/Creatinine Ratio 16 12 - 28   Sodium 143 134 - 144 mmol/L   Potassium 4.5 3.5 - 5.2 mmol/L   Chloride 105 96 - 106 mmol/L   CO2 23 20 - 29 mmol/L   Calcium 9.5 8.7 - 10.3 mg/dL   Total Protein 6.6 6.0 - 8.5 g/dL   Albumin 4.5 3.6 - 4.8 g/dL   Globulin, Total 2.1 1.5 - 4.5 g/dL   Albumin/Globulin Ratio 2.1 1.2 - 2.2   Bilirubin Total 0.5 0.0 - 1.2 mg/dL   Alkaline Phosphatase 57 39 - 117 IU/L   AST 19 0 - 40 IU/L   ALT 24 0 - 32 IU/L  Lipid Panel w/o Chol/HDL Ratio  Result Value Ref Range   Cholesterol, Total 240 (H) 100 - 199 mg/dL   Triglycerides 61 0 - 149 mg/dL   HDL 91 >30 mg/dL   VLDL Cholesterol Cal 12 5 - 40 mg/dL   LDL Calculated 160 (H) 0 - 99 mg/dL  TSH  Result Value Ref Range   TSH 1.370 0.450 - 4.500 uIU/mL  VITAMIN D 25 Hydroxy (Vit-D Deficiency, Fractures)  Result Value Ref Range   Vit D, 25-Hydroxy 28.7 (L) 30.0 - 100.0 ng/mL      Assessment & Plan:   Problem List Items Addressed This Visit      Other   Lumbago - Primary    Acute, will repeat lumbar spine imaging.  Discussed with patient.  Recommend she take daily Vitamin D3 and calcium, educated on doses.  Prednisone script sent to pharmacy, have recommended if no improvement by Friday to notify provider and will trial short period of Gabapentin since she is traveling.  Will avoid opioids due to Ambien and Ativan use.  Recommend use of Tylenol as needed at home + alternating ice/heat.  May perform gentle PT stretches at home.  If worsening or continued pain, notify provider.      Relevant Medications   predniSONE (DELTASONE) 10 MG tablet   Other Relevant Orders   DG Lumbar Spine Complete       Follow up plan: Return if symptoms worsen or fail to improve.

## 2019-08-06 NOTE — Assessment & Plan Note (Signed)
Acute, will repeat lumbar spine imaging.  Discussed with patient.  Recommend she take daily Vitamin D3 and calcium, educated on doses.  Prednisone script sent to pharmacy, have recommended if no improvement by Friday to notify provider and will trial short period of Gabapentin since she is traveling.  Will avoid opioids due to Ambien and Ativan use.  Recommend use of Tylenol as needed at home + alternating ice/heat.  May perform gentle PT stretches at home.  If worsening or continued pain, notify provider.

## 2019-10-15 ENCOUNTER — Other Ambulatory Visit: Payer: Self-pay | Admitting: Nurse Practitioner

## 2019-10-15 ENCOUNTER — Telehealth: Payer: Self-pay | Admitting: Nurse Practitioner

## 2019-10-15 MED ORDER — PREDNISONE 10 MG PO TABS: ORAL_TABLET | ORAL | 0 refills | Status: DC

## 2019-10-15 MED ORDER — GABAPENTIN 300 MG PO CAPS: 300.0000 mg | ORAL_CAPSULE | Freq: Every day | ORAL | 0 refills | Status: DC

## 2019-10-15 NOTE — Telephone Encounter (Signed)
Routing to provider  

## 2019-10-15 NOTE — Telephone Encounter (Signed)
Patient notified and verbalized understanding. 

## 2019-10-15 NOTE — Telephone Encounter (Signed)
Sent these in as she is going out of town, but please alert her if ongoing and needs further fills on medications she will need to come see me please.  Thank you.

## 2019-10-15 NOTE — Telephone Encounter (Signed)
Patient had a sciatic flare up and requesting predniSONE (DELTASONE) 10 MG tablet and gabapentin, please send to the pharmacy mentioned below. Patient is going out of town by 2pm and would like a follow up call from nurse today.   [facility] [address], [place], [place] [address]

## 2020-02-06 ENCOUNTER — Other Ambulatory Visit: Payer: Self-pay | Admitting: Nurse Practitioner

## 2020-02-06 MED ORDER — LORAZEPAM 1 MG PO TABS: 1.0000 mg | ORAL_TABLET | Freq: Every evening | ORAL | 0 refills | Status: DC | PRN

## 2020-02-06 NOTE — Telephone Encounter (Signed)
Refill request for Lorazepam (See Below) LOV: 08/06/19 Next Appt: 03/07/20

## 2020-02-06 NOTE — Telephone Encounter (Signed)
Medication Refill - Medication:  LORazepam (ATIVAN) 1 MG tablet [[id number]]    Has the patient contacted their pharmacy? No. (Agent: If no, request that the patient contact the pharmacy for the refill.) (Agent: If yes, when and what did the pharmacy advise?)  Preferred Pharmacy (with phone number or street name): [facility] [place] / she has switched pharmacy  Agent: Please be advised that RX refills may take up to 3 business days. We ask that you follow-up with your pharmacy.

## 2020-03-07 ENCOUNTER — Ambulatory Visit (INDEPENDENT_AMBULATORY_CARE_PROVIDER_SITE_OTHER): Payer: Managed Care, Other (non HMO) | Admitting: Nurse Practitioner

## 2020-03-07 ENCOUNTER — Other Ambulatory Visit: Payer: Self-pay

## 2020-03-07 ENCOUNTER — Encounter: Payer: Self-pay | Admitting: Nurse Practitioner

## 2020-03-07 VITALS — BP 106/73 | HR 76 | Temp 98.0°F | Ht 64.6 in | Wt 155.0 lb

## 2020-03-07 DIAGNOSIS — F419 Anxiety disorder, unspecified: Secondary | ICD-10-CM | POA: Diagnosis not present

## 2020-03-07 DIAGNOSIS — F5101 Primary insomnia: Secondary | ICD-10-CM | POA: Diagnosis not present

## 2020-03-07 DIAGNOSIS — E559 Vitamin D deficiency, unspecified: Secondary | ICD-10-CM

## 2020-03-07 DIAGNOSIS — Z23 Encounter for immunization: Secondary | ICD-10-CM

## 2020-03-07 DIAGNOSIS — Z1231 Encounter for screening mammogram for malignant neoplasm of breast: Secondary | ICD-10-CM

## 2020-03-07 DIAGNOSIS — M85851 Other specified disorders of bone density and structure, right thigh: Secondary | ICD-10-CM | POA: Diagnosis not present

## 2020-03-07 DIAGNOSIS — E782 Mixed hyperlipidemia: Secondary | ICD-10-CM

## 2020-03-07 DIAGNOSIS — Z0000 Encounter for general adult medical examination without abnormal findings: Secondary | ICD-10-CM

## 2020-03-07 NOTE — Assessment & Plan Note (Signed)
Ongoing, stable with minimal use of Ambien or Ativan.  She is aware of risks of chronic use.  Wishes not to use daily.  UDS today and refills as needed.

## 2020-03-07 NOTE — Assessment & Plan Note (Signed)
Currently diet controlled with recent ASCVD 4.4%.  Continue diet focus and exercise -- recheck lipid panel today.

## 2020-03-07 NOTE — Assessment & Plan Note (Signed)
Chronic, stable with minimal use of Ativan.  #30 pills lasts one year on PMP review and discussion with patient.  She is aware of risks of benzo use chronically.  UDS today, obtain contract next visit.  Denies SI/HI.

## 2020-03-07 NOTE — Progress Notes (Signed)
BP 106/73   Pulse 76   Temp 98 F (36.7 C) (Oral)   Ht 5' 4.6" (1.641 m)   Wt 155 lb (70.3 kg)   LMP  (LMP Unknown)   SpO2 97%   BMI 26.11 kg/m    Subjective:    Patient ID: Kristen May, female    DOB: [birth year], 69 y.o.   MRN: [id number]  HPI: Kristen May is a 69 y.o. female presenting on 03/07/2020 for comprehensive medical examination. Current medical complaints include:none  She currently lives with: husband Menopausal Symptoms: no   INSOMNIA Continues on Ambien at bedtime, has not taken in years.    Duration: chronic Satisfied with sleep quality: yes Difficulty falling asleep: yes Difficulty staying asleep: no Waking a few hours after sleep onset: no Early morning awakenings: no Daytime hypersomnolence: no Wakes feeling refreshed: yes Good sleep hygiene: yes Apnea: no Snoring: no Depressed/anxious mood: no Recent stress: no Restless legs/nocturnal leg cramps: no Chronic pain/arthritis: no History of sleep study: no Treatments attempted: [employer] focus.   Hyperlipidemia status: good compliance Satisfied with current treatment?  yes Side effects:  no Medication compliance: good compliance Supplements: none Aspirin:  no The 10-year ASCVD risk score Denman George DC Jr., et al., 2013) is: 4.4%   Values used to calculate the score:     Age: 53 years     Sex: Female     Is Non-Hispanic African American: No     Diabetic: No     Tobacco smoker: No     Systolic Blood Pressure: 106 mmHg     Is BP treated: No     HDL Cholesterol: 91 mg/dL     Total Cholesterol: 240 mg/dL Chest pain:  no Coronary artery disease:  no Family history CAD:  no Family history early CAD:  no  DEPRESSION Currently takes occasional Ativan, has been on for a long while.  She rarely takes this, gets refills once a year #30 pills.  Pt is aware of risks of benzo medication use to include increased sedation, respiratory suppression, falls, dependence and  cardiovascular events. Pt would like to continue treatment as benefit determined to outweigh risk.   Mood status: stable Satisfied with current treatment?: yes Symptom severity: mild  Duration of current treatment : chronic Side effects: no Medication compliance: good compliance Psychotherapy/counseling: none Depressed mood: no Anxious mood: no Anhedonia: no Significant weight loss or gain: no Insomnia: yes hard to fall asleep Fatigue: no Feelings of worthlessness or guilt: no Impaired concentration/indecisiveness: no Suicidal ideations: no Hopelessness: no Crying spells: no Depression screen [facility] 2/9 03/07/2020 08/24/2017 08/23/2016  Decreased Interest 0 0 0  Down, Depressed, Hopeless 0 0 0  PHQ - 2 Score 0 0 0  Altered sleeping - 0 0  Tired, decreased energy - 0 0  Change in appetite - 0 0  Feeling bad or failure about yourself  - 0 0  Trouble concentrating - 0 0  Moving slowly or fidgety/restless - 0 0  Suicidal thoughts - 0 0  PHQ-9 Score - 0 0    The patient does not have a history of falls. I did not complete a risk assessment for falls. A plan of care for falls was not documented.   Past Medical History:  Past Medical History:  Diagnosis Date  . Anxiety   . Back pain   . Cervicalgia   . Depression   . Elevated vitamin B12 level   . Insomnia   .  Lumbago   . Osteopenia     Surgical History:  Past Surgical History:  Procedure Laterality Date  . CHOLECYSTECTOMY    . CHOLESTEATOMA EXCISION    . eye surgerry    . SKIN GRAFT      Medications:  Current Outpatient Medications on File Prior to Visit  Medication Sig  . LORazepam (ATIVAN) 1 MG tablet Take 1 tablet (1 mg total) by mouth at bedtime as needed for anxiety.  Marland Kitchen nystatin cream (MYCOSTATIN) APPLY THIN LAYER TO AFFECTED AREAS ONCE DAILY 2 3 TIMES WEEKLY FOR MAINTENANCE  . zolpidem (AMBIEN) 10 MG tablet Take 10 mg by mouth at bedtime as needed.   . gabapentin (NEURONTIN) 300 MG capsule Take 1 capsule  (300 mg total) by mouth at bedtime. (Patient not taking: Reported on 03/07/2020)   No current facility-administered medications on file prior to visit.    Allergies:  Allergies  Allergen Reactions  . Morphine And Related Itching    Social History:  Social History   Socioeconomic History  . Marital status: Widowed    Spouse name: Not on file  . Number of children: Not on file  . Years of education: Not on file  . Highest education level: Not on file  Occupational History  . Not on file  Tobacco Use  . Smoking status: Former Research scientist (life sciences)  . Smokeless tobacco: Never Used  Substance and Sexual Activity  . Alcohol use: Not Currently    Alcohol/week: 0.0 standard drinks    Comment: on occasion  . Drug use: No  . Sexual activity: Yes  Other Topics Concern  . Not on file  Social History Narrative  . Not on file   Social Determinants of Health   Financial Resource Strain:   . Difficulty of Paying Living Expenses:   Food Insecurity:   . Worried About Charity fundraiser in the Last Year:   . Arboriculturist in the Last Year:   Transportation Needs:   . Film/video editor (Medical):   Marland Kitchen Lack of Transportation (Non-Medical):   Physical Activity:   . Days of Exercise per Week:   . Minutes of Exercise per Session:   Stress:   . Feeling of Stress :   Social Connections:   . Frequency of Communication with Friends and Family:   . Frequency of Social Gatherings with Friends and Family:   . Attends Religious Services:   . Active Member of Clubs or Organizations:   . Attends Archivist Meetings:   Marland Kitchen Marital Status:   Intimate Partner Violence:   . Fear of Current or Ex-Partner:   . Emotionally Abused:   Marland Kitchen Physically Abused:   . Sexually Abused:    Social History   Tobacco Use  Smoking Status Former Smoker  Smokeless Tobacco Never Used   Social History   Substance and Sexual Activity  Alcohol Use Not Currently  . Alcohol/week: 0.0 standard drinks    Comment: on occasion    Family History:  Family History  Problem Relation Age of Onset  . Cancer Mother        stomach  . COPD Mother   . Heart disease Father   . Cancer Father        colon  . Diabetes Father   . Polycystic ovary syndrome Daughter   . Cancer Paternal Grandfather        rectal  . Depression Daughter   . Bipolar disorder Daughter     Past  medical history, surgical history, medications, allergies, family history and social history reviewed with patient today and changes made to appropriate areas of the chart.   Review of Systems - negative All other ROS negative except what is listed above and in the HPI.      Objective:    BP 106/73   Pulse 76   Temp 98 F (36.7 C) (Oral)   Ht 5' 4.6" (1.641 m)   Wt 155 lb (70.3 kg)   LMP  (LMP Unknown)   SpO2 97%   BMI 26.11 kg/m   Wt Readings from Last 3 Encounters:  03/07/20 155 lb (70.3 kg)  12/18/18 158 lb (71.7 kg)  08/24/17 156 lb 9.6 oz (71 kg)    Physical Exam Constitutional:      General: She is awake. She is not in acute distress.    Appearance: She is well-developed. She is not ill-appearing.  HENT:     Head: Normocephalic and atraumatic.     Right Ear: Hearing, tympanic membrane, ear canal and external ear normal. No drainage.     Left Ear: Hearing, tympanic membrane, ear canal and external ear normal. No drainage.     Nose: Nose normal.     Right Sinus: No maxillary sinus tenderness or frontal sinus tenderness.     Left Sinus: No maxillary sinus tenderness or frontal sinus tenderness.     Mouth/Throat:     Mouth: Mucous membranes are moist.     Pharynx: Oropharynx is clear. Uvula midline. No pharyngeal swelling, oropharyngeal exudate or posterior oropharyngeal erythema.  Eyes:     General: Lids are normal.        Right eye: No discharge.        Left eye: No discharge.     Extraocular Movements: Extraocular movements intact.     Conjunctiva/sclera: Conjunctivae normal.     Pupils: Pupils are  equal, round, and reactive to light.     Visual Fields: Right eye visual fields normal and left eye visual fields normal.  Neck:     Thyroid: No thyromegaly.     Vascular: No carotid bruit.     Trachea: Trachea normal.  Cardiovascular:     Rate and Rhythm: Normal rate and regular rhythm.     Heart sounds: Normal heart sounds. No murmur. No gallop.   Pulmonary:     Effort: Pulmonary effort is normal. No accessory muscle usage or respiratory distress.     Breath sounds: Normal breath sounds.  Chest:     Breasts:        Right: Normal.        Left: Normal.  Abdominal:     General: Bowel sounds are normal.     Palpations: Abdomen is soft. There is no hepatomegaly or splenomegaly.     Tenderness: There is no abdominal tenderness.  Musculoskeletal:        General: Normal range of motion.     Cervical back: Normal range of motion and neck supple.     Right lower leg: No edema.     Left lower leg: No edema.  Lymphadenopathy:     Head:     Right side of head: No submental, submandibular, tonsillar, preauricular or posterior auricular adenopathy.     Left side of head: No submental, submandibular, tonsillar, preauricular or posterior auricular adenopathy.     Cervical: No cervical adenopathy.     Upper Body:     Right upper body: No supraclavicular, axillary or pectoral adenopathy.  Left upper body: No supraclavicular, axillary or pectoral adenopathy.  Skin:    General: Skin is warm and dry.     Capillary Refill: Capillary refill takes less than 2 seconds.     Findings: No rash.  Neurological:     Mental Status: She is alert and oriented to person, place, and time.     Cranial Nerves: Cranial nerves are intact.     Gait: Gait is intact.     Deep Tendon Reflexes: Reflexes are normal and symmetric.     Reflex Scores:      Brachioradialis reflexes are 2+ on the right side and 2+ on the left side.      Patellar reflexes are 2+ on the right side and 2+ on the left side. Psychiatric:         Attention and Perception: Attention normal.        Mood and Affect: Mood normal.        Speech: Speech normal.        Behavior: Behavior normal. Behavior is cooperative.        Thought Content: Thought content normal.        Judgment: Judgment normal.    Results for orders placed or performed in visit on 08/24/17  CBC with Differential/Platelet  Result Value Ref Range   WBC 6.3 3.4 - 10.8 x10E3/uL   RBC 4.00 3.77 - 5.28 x10E6/uL   Hemoglobin 13.4 11.1 - 15.9 g/dL   Hematocrit 32.938.7 51.834.0 - 46.6 %   MCV 97 79 - 97 fL   MCH 33.5 (H) 26.6 - 33.0 pg   MCHC 34.6 31.5 - 35.7 g/dL   RDW 84.113.0 66.012.3 - 63.015.4 %   Platelets 265 150 - 379 x10E3/uL   Neutrophils 44 Not Estab. %   Lymphs 40 Not Estab. %   Monocytes 8 Not Estab. %   Eos 7 Not Estab. %   Basos 1 Not Estab. %   Neutrophils Absolute 2.8 1.4 - 7.0 x10E3/uL   Lymphocytes Absolute 2.5 0.7 - 3.1 x10E3/uL   Monocytes Absolute 0.5 0.1 - 0.9 x10E3/uL   EOS (ABSOLUTE) 0.4 0.0 - 0.4 x10E3/uL   Basophils Absolute 0.0 0.0 - 0.2 x10E3/uL   Immature Granulocytes 0 Not Estab. %   Immature Grans (Abs) 0.0 0.0 - 0.1 x10E3/uL  Comprehensive metabolic panel  Result Value Ref Range   Glucose 84 65 - 99 mg/dL   BUN 13 8 - 27 mg/dL   Creatinine, Ser 1.600.81 0.57 - 1.00 mg/dL   GFR calc non Af Amer 76 >59 mL/min/1.73   GFR calc Af Amer 88 >59 mL/min/1.73   BUN/Creatinine Ratio 16 12 - 28   Sodium 143 134 - 144 mmol/L   Potassium 4.5 3.5 - 5.2 mmol/L   Chloride 105 96 - 106 mmol/L   CO2 23 20 - 29 mmol/L   Calcium 9.5 8.7 - 10.3 mg/dL   Total Protein 6.6 6.0 - 8.5 g/dL   Albumin 4.5 3.6 - 4.8 g/dL   Globulin, Total 2.1 1.5 - 4.5 g/dL   Albumin/Globulin Ratio 2.1 1.2 - 2.2   Bilirubin Total 0.5 0.0 - 1.2 mg/dL   Alkaline Phosphatase 57 39 - 117 IU/L   AST 19 0 - 40 IU/L   ALT 24 0 - 32 IU/L  Lipid Panel w/o Chol/HDL Ratio  Result Value Ref Range   Cholesterol, Total 240 (H) 100 - 199 mg/dL   Triglycerides 61 0 - 149 mg/dL   HDL 91 >10>39  mg/dL   VLDL Cholesterol Cal 12 5 - 40 mg/dL   LDL Calculated 160 (H) 0 - 99 mg/dL  TSH  Result Value Ref Range   TSH 1.370 0.450 - 4.500 uIU/mL  VITAMIN D 25 Hydroxy (Vit-D Deficiency, Fractures)  Result Value Ref Range   Vit D, 25-Hydroxy 28.7 (L) 30.0 - 100.0 ng/mL      Assessment & Plan:   Problem List Items Addressed This Visit      Musculoskeletal and Integument   Osteopenia    Last DEXA 2014, will plan on repeat next year as wishes to wait.  Report from previous DEXA unable to be pulled up in Epic, continue daily Vitamin D and CA+.        Other   Insomnia    Ongoing, stable with minimal use of Ambien or Ativan.  She is aware of risks of chronic use.  Wishes not to use daily.  UDS today and refills as needed.      Relevant Orders   [id number] 11+Oxyco+Alc+Crt-Bund   Anxiety    Chronic, stable with minimal use of Ativan.  #30 pills lasts one year on PMP review and discussion with patient.  She is aware of risks of benzo use chronically.  UDS today, obtain contract next visit.  Denies SI/HI.      Relevant Orders   [id number] 11+Oxyco+Alc+Crt-Bund   Hyperlipidemia    Currently diet controlled with recent ASCVD 4.4%.  Continue diet focus and exercise -- recheck lipid panel today.      Relevant Orders   Comprehensive metabolic panel   Lipid Panel w/o Chol/HDL Ratio    Other Visit Diagnoses    Annual physical exam    -  Primary   Annual labs -- CBC, CMP, lipid, TSH   Relevant Orders   CBC with Differential/Platelet   TSH   Vitamin D deficiency       History of low levels, check today and continue supplement.   Relevant Orders   VITAMIN D 25 Hydroxy (Vit-D Deficiency, Fractures)   Need for pneumococcal vaccination       Pneumonia vaccine today   Relevant Orders   Pneumococcal polysaccharide vaccine 23-valent greater than or equal to 2yo subcutaneous/IM (Completed)   Encounter for screening mammogram for malignant neoplasm of breast       Mammogram ordered.   Relevant  Orders   MM DIGITAL SCREENING BILATERAL       Follow up plan: Return in about 1 year (around 03/07/2021) for Annual physical.   LABORATORY TESTING:  - Pap smear: not applicable  IMMUNIZATIONS:   - Tdap: Tetanus vaccination status reviewed: last tetanus booster within 10 years. - Influenza: Up to date - Pneumovax: Up to date - Prevnar: Up to date - HPV: Not applicable - Zostavax vaccine: wishes to talk to insurance first  SCREENING: -Mammogram: Ordered today  - Colonoscopy: Up to date  - Bone Density: Up to date  -Hearing Test: Not applicable  -Spirometry: Not applicable   PATIENT COUNSELING:   Advised to take 1 mg of folate supplement per day if capable of pregnancy.   Sexuality: Discussed sexually transmitted diseases, partner selection, use of condoms, avoidance of unintended pregnancy  and contraceptive alternatives.   Advised to avoid cigarette smoking.  I discussed with the patient that most people either abstain from alcohol or drink within safe limits (<=14/week and <=4 drinks/occasion for males, <=7/weeks and <= 3 drinks/occasion for females) and that the risk for alcohol disorders and other health  effects rises proportionally with the number of drinks per week and how often a drinker exceeds daily limits.  Discussed cessation/primary prevention of drug use and availability of treatment for abuse.   Diet: Encouraged to adjust caloric intake to maintain  or achieve ideal body weight, to reduce intake of dietary saturated fat and total fat, to limit sodium intake by avoiding high sodium foods and not adding table salt, and to maintain adequate dietary potassium and calcium preferably from fresh fruits, vegetables, and low-fat dairy products.    stressed the importance of regular exercise  Injury prevention: Discussed safety belts, safety helmets, smoke detector, smoking near bedding or upholstery.   Dental health: Discussed importance of regular tooth brushing,  flossing, and dental visits.    NEXT PREVENTATIVE PHYSICAL DUE IN 1 YEAR. Return in about 1 year (around 03/07/2021) for Annual physical.

## 2020-03-07 NOTE — Assessment & Plan Note (Signed)
Last DEXA 2014, will plan on repeat next year as wishes to wait.  Report from previous DEXA unable to be pulled up in Epic, continue daily Vitamin D and CA+.

## 2020-03-07 NOTE — Patient Instructions (Addendum)
Managing Anxiety, Adult After being diagnosed with an anxiety disorder, you may be relieved to know why you have felt or behaved a certain way. You may also feel overwhelmed about the treatment ahead and what it will mean for your life. With care and support, you can manage this condition and recover from it. How to manage lifestyle changes Managing stress and anxiety  Stress is your body's reaction to life changes and events, both good and bad. Most stress will last just a few hours, but stress can be ongoing and can lead to more than just stress. Although stress can play a major role in anxiety, it is not the same as anxiety. Stress is usually caused by something external, such as a deadline, test, or competition. Stress normally passes after the triggering event has ended.  Anxiety is caused by something internal, such as imagining a terrible outcome or worrying that something will go wrong that will devastate you. Anxiety often does not go away even after the triggering event is over, and it can become long-term (chronic) worry. It is important to understand the differences between stress and anxiety and to manage your stress effectively so that it does not lead to an anxious response. Talk with your health care provider or a counselor to learn more about reducing anxiety and stress. He or she may suggest tension reduction techniques, such as:  Music therapy. This can include creating or listening to music that you enjoy and that inspires you.  Mindfulness-based meditation. This involves being aware of your normal breaths while not trying to control your breathing. It can be done while sitting or walking.  Centering prayer. This involves focusing on a word, phrase, or sacred image that means something to you and brings you peace.  Deep breathing. To do this, expand your stomach and inhale slowly through your nose. Hold your breath for 3-5 seconds. Then exhale slowly, letting your stomach muscles  relax.  Self-talk. This involves identifying thought patterns that lead to anxiety reactions and changing those patterns.  Muscle relaxation. This involves tensing muscles and then relaxing them. Choose a tension reduction technique that suits your lifestyle and personality. These techniques take time and practice. Set aside 5-15 minutes a day to do them. Therapists can offer counseling and training in these techniques. The training to help with anxiety may be covered by some insurance plans. Other things you can do to manage stress and anxiety include:  Keeping a stress/anxiety diary. This can help you learn what triggers your reaction and then learn ways to manage your response.  Thinking about how you react to certain situations. You may not be able to control everything, but you can control your response.  Making time for activities that help you relax and not feeling guilty about spending your time in this way.  Visual imagery and yoga can help you stay calm and relax.  Medicines Medicines can help ease symptoms. Medicines for anxiety include:  Anti-anxiety drugs.  Antidepressants. Medicines are often used as a primary treatment for anxiety disorder. Medicines will be prescribed by a health care provider. When used together, medicines, psychotherapy, and tension reduction techniques may be the most effective treatment. Relationships Relationships can play a big part in helping you recover. Try to spend more time connecting with trusted friends and family members. Consider going to couples counseling, taking family education classes, or going to family therapy. Therapy can help you and others better understand your condition. How to recognize changes in your   anxiety Everyone responds differently to treatment for anxiety. Recovery from anxiety happens when symptoms decrease and stop interfering with your daily activities at home or work. This may mean that you will start to:  Have  better concentration and focus. Worry will interfere less in your daily thinking.  Sleep better.  Be less irritable.  Have more energy.  Have improved memory. It is important to recognize when your condition is getting worse. Contact your health care provider if your symptoms interfere with home or work and you feel like your condition is not improving. Follow these instructions at home: Activity  Exercise. Most adults should do the following: ? Exercise for at least 150 minutes each week. The exercise should increase your heart rate and make you sweat (moderate-intensity exercise). ? Strengthening exercises at least twice a week.  Get the right amount and quality of sleep. Most adults need 7-9 hours of sleep each night. Lifestyle   Eat a healthy diet that includes plenty of vegetables, fruits, whole grains, low-fat dairy products, and lean protein. Do not eat a lot of foods that are high in solid fats, added sugars, or salt.  Make choices that simplify your life.  Do not use any products that contain nicotine or tobacco, such as cigarettes, e-cigarettes, and chewing tobacco. If you need help quitting, ask your health care provider.  Avoid caffeine, alcohol, and certain over-the-counter cold medicines. These may make you feel worse. Ask your pharmacist which medicines to avoid. General instructions  Take over-the-counter and prescription medicines only as told by your health care provider.  Keep all follow-up visits as told by your health care provider. This is important. Where to find support You can get help and support from these sources:  Self-help groups.  Online and community organizations.  A trusted spiritual leader.  Couples counseling.  Family education classes.  Family therapy. Where to find more information You may find that joining a support group helps you deal with your anxiety. The following sources can help you locate counselors or support groups near  you:  Mental Health America: www.mentalhealthamerica.net  Anxiety and Depression Association of America (ADAA): www.adaa.org  National Alliance on Mental Illness (NAMI): www.nami.org Contact a health care provider if you:  Have a hard time staying focused or finishing daily tasks.  Spend many hours a day feeling worried about everyday life.  Become exhausted by worry.  Start to have headaches, feel tense, or have nausea.  Urinate more than normal.  Have diarrhea. Get help right away if you have:  A racing heart and shortness of breath.  Thoughts of hurting yourself or others. If you ever feel like you may hurt yourself or others, or have thoughts about taking your own life, get help right away. You can go to your nearest emergency department or call:  Your local emergency services (911 in the [place]  A suicide crisis helpline, such as the National Suicide Prevention Lifeline at 1-800-273-8255. This is open 24 hours a day. Summary  Taking steps to learn and use tension reduction techniques can help calm you and help prevent triggering an anxiety reaction.  When used together, medicines, psychotherapy, and tension reduction techniques may be the most effective treatment.  Family, friends, and partners can play a big part in helping you recover from an anxiety disorder. This information is not intended to replace advice given to you by your health care provider. Make sure you discuss any questions you have with your health care provider. Document Revised:   04/03/2019 Document Reviewed: 04/03/2019 Elsevier Patient Education  [place]. Pneumococcal Polysaccharide Vaccine (PPSV23): What You Need to Know 1. Why get vaccinated? Pneumococcal polysaccharide vaccine (PPSV23) can prevent pneumococcal disease. Pneumococcal disease refers to any illness caused by pneumococcal bacteria. These bacteria can cause many types of illnesses, including pneumonia, which is an infection  of the lungs. Pneumococcal bacteria are one of the most common causes of pneumonia. Besides pneumonia, pneumococcal bacteria can also cause:  Ear infections  Sinus infections  Meningitis (infection of the tissue covering the brain and spinal cord)  Bacteremia (bloodstream infection) Anyone can get pneumococcal disease, but children under 73 years of age, people with certain medical conditions, adults 72 years or older, and cigarette smokers are at the highest risk. Most pneumococcal infections are mild. However, some can result in long-term problems, such as brain damage or hearing loss. Meningitis, bacteremia, and pneumonia caused by pneumococcal disease can be fatal. 2. PPSV23 PPSV23 protects against 23 types of bacteria that cause pneumococcal disease. PPSV23 is recommended for:  All adults 39 years or older,  Anyone 2 years or older with certain medical conditions that can lead to an increased risk for pneumococcal disease. Most people need only one dose of PPSV23. A second dose of PPSV23, and another type of pneumococcal vaccine called PCV13, are recommended for certain high-risk groups. Your health care provider can give you more information. People 65 years or older should get a dose of PPSV23 even if they have already gotten one or more doses of the vaccine before they turned 45. 3. Talk with your health care provider Tell your vaccine provider if the person getting the vaccine:  Has had an allergic reaction after a previous dose of PPSV23, or has any severe, life-threatening allergies. In some cases, your health care provider may decide to postpone PPSV23 vaccination to a future visit. People with minor illnesses, such as a cold, may be vaccinated. People who are moderately or severely ill should usually wait until they recover before getting PPSV23. Your health care provider can give you more information. 4. Risks of a vaccine reaction  Redness or pain where the shot is given,  feeling tired, fever, or muscle aches can happen after PPSV23. People sometimes faint after medical procedures, including vaccination. Tell your provider if you feel dizzy or have vision changes or ringing in the ears. As with any medicine, there is a very remote chance of a vaccine causing a severe allergic reaction, other serious injury, or death. 5. What if there is a serious problem? An allergic reaction could occur after the vaccinated person leaves the clinic. If you see signs of a severe allergic reaction (hives, swelling of the face and throat, difficulty breathing, a fast heartbeat, dizziness, or weakness), call 9-1-1 and get the person to the nearest hospital. For other signs that concern you, call your health care provider. Adverse reactions should be reported to the Vaccine Adverse Event Reporting System (VAERS). Your health care provider will usually file this report, or you can do it yourself. Visit the VAERS website at www.vaers.SamedayNews.es or call 6107164296. VAERS is only for reporting reactions, and VAERS staff do not give medical advice. 6. How can I learn more?  Ask your health care provider.  Call your local or state health department.  Contact the Centers for Disease Control and Prevention (CDC): ? Call 409-619-7553 (1-800-CDC-INFO) or ? Visit CDC's website at http://hunter.com/ CDC Vaccine Information Statement PPSV23 Vaccine (09/13/2018) This information is not intended to replace  advice given to you by your health care provider. Make sure you discuss any questions you have with your health care provider. Document Revised: 02/20/2019 Document Reviewed: 06/13/2018 Elsevier Patient Education  2020 [employer].

## 2020-03-08 LAB — COMPREHENSIVE METABOLIC PANEL WITH GFR
ALT: 26 [IU]/L (ref 0–32)
AST: 19 [IU]/L (ref 0–40)
Albumin/Globulin Ratio: 2.4 — ABNORMAL HIGH (ref 1.2–2.2)
Albumin: 4.5 g/dL (ref 3.8–4.8)
Alkaline Phosphatase: 58 [IU]/L (ref 39–117)
BUN/Creatinine Ratio: 13 (ref 12–28)
BUN: 11 mg/dL (ref 8–27)
Bilirubin Total: 0.3 mg/dL (ref 0.0–1.2)
CO2: 22 mmol/L (ref 20–29)
Calcium: 9.9 mg/dL (ref 8.7–10.3)
Chloride: 102 mmol/L (ref 96–106)
Creatinine, Ser: 0.86 mg/dL (ref 0.57–1.00)
GFR calc Af Amer: 81 mL/min/{1.73_m2}
GFR calc non Af Amer: 70 mL/min/{1.73_m2}
Globulin, Total: 1.9 g/dL (ref 1.5–4.5)
Glucose: 88 mg/dL (ref 65–99)
Potassium: 4.2 mmol/L (ref 3.5–5.2)
Sodium: 144 mmol/L (ref 134–144)
Total Protein: 6.4 g/dL (ref 6.0–8.5)

## 2020-03-08 LAB — CBC WITH DIFFERENTIAL/PLATELET
Basophils Absolute: 0.1 10*3/uL (ref 0.0–0.2)
Basos: 1 %
EOS (ABSOLUTE): 0.4 10*3/uL (ref 0.0–0.4)
Eos: 5 %
Hematocrit: 41.3 % (ref 34.0–46.6)
Hemoglobin: 13.8 g/dL (ref 11.1–15.9)
Immature Grans (Abs): 0 10*3/uL (ref 0.0–0.1)
Immature Granulocytes: 0 %
Lymphocytes Absolute: 2.5 10*3/uL (ref 0.7–3.1)
Lymphs: 36 %
MCH: 32.5 pg (ref 26.6–33.0)
MCHC: 33.4 g/dL (ref 31.5–35.7)
MCV: 97 fL (ref 79–97)
Monocytes Absolute: 0.6 10*3/uL (ref 0.1–0.9)
Monocytes: 9 %
Neutrophils Absolute: 3.4 10*3/uL (ref 1.4–7.0)
Neutrophils: 49 %
Platelets: 306 10*3/uL (ref 150–450)
RBC: 4.25 x10E6/uL (ref 3.77–5.28)
RDW: 12.8 % (ref 11.7–15.4)
WBC: 7 10*3/uL (ref 3.4–10.8)

## 2020-03-08 LAB — VITAMIN D 25 HYDROXY (VIT D DEFICIENCY, FRACTURES): Vit D, 25-Hydroxy: 36.4 ng/mL (ref 30.0–100.0)

## 2020-03-08 LAB — DRUG SCREEN 764883 11+OXYCO+ALC+CRT-BUND
Amphetamines, Urine: NEGATIVE ng/mL
BENZODIAZ UR QL: NEGATIVE ng/mL
Barbiturate: NEGATIVE ng/mL
Cannabinoid Quant, Ur: NEGATIVE ng/mL
Cocaine (Metabolite): NEGATIVE ng/mL
Creatinine: 123.5 mg/dL (ref 20.0–300.0)
Ethanol: NEGATIVE %
Meperidine: NEGATIVE ng/mL
Methadone Screen, Urine: NEGATIVE ng/mL
OPIATE SCREEN URINE: NEGATIVE ng/mL
Oxycodone/Oxymorphone, Urine: NEGATIVE ng/mL
Phencyclidine: NEGATIVE ng/mL
Propoxyphene: NEGATIVE ng/mL
Tramadol Ur: NEGATIVE ng/mL
pH, Urine: 6.1 (ref 4.5–8.9)

## 2020-03-08 LAB — LIPID PANEL W/O CHOL/HDL RATIO
Cholesterol, Total: 240 mg/dL — ABNORMAL HIGH (ref 100–199)
HDL: 75 mg/dL (ref >39)
LDL Chol Calc (NIH): 150 mg/dL — ABNORMAL HIGH (ref 0–99)
Triglycerides: 88 mg/dL (ref 0–149)
VLDL Cholesterol Cal: 15 mg/dL (ref 5–40)

## 2020-03-08 LAB — TSH: TSH: 1.8 u[IU]/mL (ref 0.450–4.500)

## 2020-03-09 NOTE — Progress Notes (Signed)
Contacted via MyChart Good morning Kristen May.  Your labs have returned.  Everything looks great with exception of cholesterol levels which are still elevated, however your cardiac risk score remains on lower side at 4.7%.  I recommend continued focus on diet.   To reduce your LDL, Remember - more fruits and vegetables, more fish, and limit red meat and dairy products. More soy, nuts, beans, barley, lentils, oats and plant sterol ester enriched margarine instead of butter. I also encourage eliminating sugar and processed food. Remember, shop on the outside of the grocery store and visit your [employer]. If you would like to talk with me about dietary changes for your cholesterol, please let me know. We should recheck your cholesterol in 12 months. Keep being awesome!! Kindest regards, Shakeira Rhee  The 10-year ASCVD risk score Denman George DC Jr., et al., 2013) is: 4.7%   Values used to calculate the score:     Age: 69 years     Sex: Female     Is Non-Hispanic African American: No     Diabetic: No     Tobacco smoker: No     Systolic Blood Pressure: 106 mmHg     Is BP treated: No     HDL Cholesterol: 75 mg/dL     Total Cholesterol: 240 mg/dL

## 2020-12-06 DIAGNOSIS — R1319 Other dysphagia: Secondary | ICD-10-CM | POA: Insufficient documentation

## 2020-12-11 ENCOUNTER — Ambulatory Visit: Payer: Self-pay | Admitting: *Deleted

## 2020-12-11 NOTE — Telephone Encounter (Signed)
Patient reports she was seen at ED-last weekend- swallowing issue- food impaction.  Patient was tested for COVID- negative. Patient has cough, chills and low grade temp- Tuesday. Home testing for COVID are negative. Fatigue worse. Using tylenol. Patient states she has dry cough- SOB with cough. She has questions about aspiration and what to do next. Due to SOB- advise ED.  Reason for Disposition . [1] MODERATE difficulty breathing (e.g., speaks in phrases, SOB even at rest, pulse 100-120) AND [2] NEW-onset or WORSE than normal  Answer Assessment - Initial Assessment Questions 1. RESPIRATORY STATUS: "Describe your breathing?" (e.g., wheezing, shortness of breath, unable to speak, severe coughing)      SOB- with talking, with deep breath- shallow bretahing 2. ONSET: "When did this breathing problem begin?"      Monday- symptoms seem to be worse 3. PATTERN "Does the difficult breathing come and go, or has it been constant since it started?"      constant 4. SEVERITY: "How bad is your breathing?" (e.g., mild, moderate, severe)    - MILD: No SOB at rest, mild SOB with walking, speaks normally in sentences, can lay down, no retractions, pulse < 100.    - MODERATE: SOB at rest, SOB with minimal exertion and prefers to sit, cannot lie down flat, speaks in phrases, mild retractions, audible wheezing, pulse 100-120.    - SEVERE: Very SOB at rest, speaks in single words, struggling to breathe, sitting hunched forward, retractions, pulse > 120      moderate 5. RECURRENT SYMPTOM: "Have you had difficulty breathing before?" If Yes, ask: "When was the last time?" and "What happened that time?"      no 6. CARDIAC HISTORY: "Do you have any history of heart disease?" (e.g., heart attack, angina, bypass surgery, angioplasty)      no 7. LUNG HISTORY: "Do you have any history of lung disease?"  (e.g., pulmonary embolus, asthma, emphysema)     Did have endoscopy procedure 8. CAUSE: "What do you think is causing  the breathing problem?"      COVID, procedure- condition 9. OTHER SYMPTOMS: "Do you have any other symptoms? (e.g., dizziness, runny nose, cough, chest pain, fever)     Cough, fever 10. PREGNANCY: "Is there any chance you are pregnant?" "When was your last menstrual period?"       n/a 11. TRAVEL: "Have you traveled out of the country in the last month?" (e.g., travel history, exposures)       no  Protocols used: BREATHING DIFFICULTY-A-AH

## 2020-12-17 ENCOUNTER — Telehealth: Payer: Self-pay

## 2020-12-17 NOTE — Telephone Encounter (Signed)
Called pt to see about possibly getting her in earlier, no answer, left vm  Copied from CRM [id number]. Topic: General - Other >> Dec 17, 2020  9:27 AM Tamela Oddi wrote: Reason for CRM: Patient would like the nurse or doctor to call her regarding the antibiotic she has been taking because she is still experiencing some cough.  She has tested negative for covid and flu and does not want it to turn into pneumonia.  Would like to discuss what she should do before her appt. Next week.  Please advise and call to discuss at [phone]

## 2020-12-17 NOTE — Telephone Encounter (Signed)
Patient has returned call and would like be contacted again if possible. Patient was away from phone momentarily and states they will have it with them

## 2020-12-17 NOTE — Telephone Encounter (Signed)
Called back pt is on amoxicillin for lung infection and 2 meds for ulcers,  covid and flu neg. She has concerns that it may turn into pneumonia she had food impaction and had an endoscopy. Pt is wanted to know if she can come into the office and if she needs to be seen sooner than 2/8.

## 2020-12-17 NOTE — Telephone Encounter (Signed)
Please let her know she is on good antibiotic to help prevent infection.  I would recommend continue this, can keep 12/24/19 appointment and we can see in office so I can listen to lungs.  If any worsening symptoms prior to then recommend call office or immediately go to UC or ER.

## 2020-12-17 NOTE — Telephone Encounter (Signed)
Called pt advised of jolene's message pt verbalized understanding

## 2020-12-19 ENCOUNTER — Encounter: Payer: Self-pay | Admitting: Nurse Practitioner

## 2020-12-23 ENCOUNTER — Ambulatory Visit (INDEPENDENT_AMBULATORY_CARE_PROVIDER_SITE_OTHER): Payer: Medicare Other | Admitting: Nurse Practitioner

## 2020-12-23 ENCOUNTER — Encounter: Payer: Self-pay | Admitting: Nurse Practitioner

## 2020-12-23 ENCOUNTER — Other Ambulatory Visit: Payer: Self-pay

## 2020-12-23 VITALS — BP 130/76 | HR 65 | Temp 98.0°F | Ht 65.51 in | Wt 162.8 lb

## 2020-12-23 DIAGNOSIS — J690 Pneumonitis due to inhalation of food and vomit: Secondary | ICD-10-CM | POA: Insufficient documentation

## 2020-12-23 DIAGNOSIS — R1319 Other dysphagia: Secondary | ICD-10-CM

## 2020-12-23 MED ORDER — GUAIFENESIN-CODEINE 100-10 MG/5ML PO SOLN: 5.0000 mL | Freq: Two times a day (BID) | ORAL | 0 refills | Status: DC | PRN

## 2020-12-23 MED ORDER — AMOXICILLIN-POT CLAVULANATE 875-125 MG PO TABS: 1.0000 | ORAL_TABLET | Freq: Two times a day (BID) | ORAL | 0 refills | Status: AC

## 2020-12-23 NOTE — Assessment & Plan Note (Signed)
Acute with ongoing cough.  Will extend Augmentin, as was taking this with Tetracycline (which she is about to complete) and this can lower concentration levels Augmentin.  Extend for 7 days for PNA.  Recommend deep breathing exercises at home.  Cough syrup script sent.  Encourage lots of rest and fluids at home.  Plan to repeat CXR prior to next visit.  Return in 4 weeks, sooner if worsening symptoms.

## 2020-12-23 NOTE — Progress Notes (Signed)
BP 130/76   Pulse 65   Temp 98 F (36.7 C) (Oral)   Ht 5' 5.51" (1.664 m)   Wt 162 lb 12.8 oz (73.8 kg)   LMP  (LMP Unknown)   SpO2 98%   BMI 26.67 kg/m    Subjective:    Patient ID: Kristen May, female    DOB: Jul 27, 1951, 70 y.o.   MRN: [id number]  HPI: Kristen May is a 70 y.o. female  Chief Complaint  Patient presents with  . Hospitalization Follow-up    Was in [facility] on 12/06/20 and had endoscopy on 12/07/20. Patient states that she was being treated for ulcers  . Cough   ER FOLLOW UP Was in [facility] on 12/06/20 and had endoscopy on 12/07/20 for esophageal dysphagia -- had some food impaction and they did not observe motility they noticed, so is having further testing.  Was treated for ulcers and H. Pylori infection -- 12/19/20 with Tetracycline.  Treated for cough on 12/11/20 at urgent care with Augmentin for food aspiration PNA -- just completed this course.  Imaging noted "Hazy right lower lobe and inferior right upper lobe airspace opacities".  She is not scheduled to see GI physician until March 30th.  Has upcoming motility testing at [facility] on 12/30/20.   Continues to have ongoing cough present -- took full course Augmentin for 7 days, however was also on tetracycline at same time and she is concerned about the interactions.   Time since discharge: 12/07/20 Hospital/facility: [facility] Diagnosis: Esophageal dysphagia Procedures/tests: endoscopy Consultants: GI  New medications: Tetracycline, Protonix, Bismuth Discharge instructions:  Follow-up with GI Status: stable  Relevant past medical, surgical, family and social history reviewed and updated as indicated. Interim medical history since our last visit reviewed. Allergies and medications reviewed and updated.  Review of Systems  Constitutional: Negative for activity change, appetite change, diaphoresis, fatigue and fever.  Respiratory: Positive for cough. Negative for chest tightness and shortness of breath.    Cardiovascular: Negative for chest pain, palpitations and leg swelling.  Neurological: Negative.   Psychiatric/Behavioral: Negative.     Per HPI unless specifically indicated above     Objective:    BP 130/76   Pulse 65   Temp 98 F (36.7 C) (Oral)   Ht 5' 5.51" (1.664 m)   Wt 162 lb 12.8 oz (73.8 kg)   LMP  (LMP Unknown)   SpO2 98%   BMI 26.67 kg/m   Wt Readings from Last 3 Encounters:  12/23/20 162 lb 12.8 oz (73.8 kg)  03/07/20 155 lb (70.3 kg)  12/18/18 158 lb (71.7 kg)    Physical Exam Vitals and nursing note reviewed.  Constitutional:      General: She is awake. She is not in acute distress.    Appearance: She is well-developed and well-groomed. She is not ill-appearing or toxic-appearing.  HENT:     Head: Normocephalic.     Right Ear: Hearing normal.     Left Ear: Hearing normal.     Nose: Nose normal.  Eyes:     General: Lids are normal.        Right eye: No discharge.        Left eye: No discharge.     Conjunctiva/sclera: Conjunctivae normal.     Pupils: Pupils are equal, round, and reactive to light.  Neck:     Thyroid: No thyromegaly.     Vascular: No carotid bruit.  Cardiovascular:     Rate and Rhythm: Normal  rate and regular rhythm.     Heart sounds: Normal heart sounds. No murmur heard. No gallop.   Pulmonary:     Effort: Pulmonary effort is normal. No accessory muscle usage or respiratory distress.     Breath sounds: Normal breath sounds.     Comments: Clear throughout all fields. Abdominal:     General: Bowel sounds are normal.     Palpations: Abdomen is soft.  Musculoskeletal:     Cervical back: Normal range of motion and neck supple.     Right lower leg: No edema.     Left lower leg: No edema.  Lymphadenopathy:     Cervical: No cervical adenopathy.  Skin:    General: Skin is warm and dry.  Neurological:     Mental Status: She is alert and oriented to person, place, and time.  Psychiatric:        Attention and Perception: Attention  normal.        Mood and Affect: Mood normal.        Speech: Speech normal.        Behavior: Behavior normal. Behavior is cooperative.        Thought Content: Thought content normal.    Results for orders placed or performed in visit on 03/07/20  [id number] 11+Oxyco+Alc+Crt-Bund  Result Value Ref Range   Ethanol Negative Cutoff=0.020 %   Amphetamines, Urine Negative Cutoff=1000 ng/mL   Barbiturate Negative Cutoff=200 ng/mL   BENZODIAZ UR QL Negative Cutoff=200 ng/mL   Cannabinoid Quant, Ur Negative Cutoff=50 ng/mL   Cocaine (Metabolite) Negative Cutoff=300 ng/mL   OPIATE SCREEN URINE Negative Cutoff=300 ng/mL   Oxycodone/Oxymorphone, Urine Negative Cutoff=300 ng/mL   Phencyclidine Negative Cutoff=25 ng/mL   Methadone Screen, Urine Negative Cutoff=300 ng/mL   Propoxyphene Negative Cutoff=300 ng/mL   Meperidine Negative Cutoff=200 ng/mL   Tramadol Negative Cutoff=200 ng/mL   Creatinine 123.5 20.0 - 300.0 mg/dL   pH, Urine 6.1 4.5 - 8.9  CBC with Differential/Platelet  Result Value Ref Range   WBC 7.0 3.4 - 10.8 x10E3/uL   RBC 4.25 3.77 - 5.28 x10E6/uL   Hemoglobin 13.8 11.1 - 15.9 g/dL   Hematocrit 21.3 08.6 - 46.6 %   MCV 97 79 - 97 fL   MCH 32.5 26.6 - 33.0 pg   MCHC 33.4 31.5 - 35.7 g/dL   RDW 57.8 46.9 - 62.9 %   Platelets 306 150 - 450 x10E3/uL   Neutrophils 49 Not Estab. %   Lymphs 36 Not Estab. %   Monocytes 9 Not Estab. %   Eos 5 Not Estab. %   Basos 1 Not Estab. %   Neutrophils Absolute 3.4 1.4 - 7.0 x10E3/uL   Lymphocytes Absolute 2.5 0.7 - 3.1 x10E3/uL   Monocytes Absolute 0.6 0.1 - 0.9 x10E3/uL   EOS (ABSOLUTE) 0.4 0.0 - 0.4 x10E3/uL   Basophils Absolute 0.1 0.0 - 0.2 x10E3/uL   Immature Granulocytes 0 Not Estab. %   Immature Grans (Abs) 0.0 0.0 - 0.1 x10E3/uL  Comprehensive metabolic panel  Result Value Ref Range   Glucose 88 65 - 99 mg/dL   BUN 11 8 - 27 mg/dL   Creatinine, Ser 5.28 0.57 - 1.00 mg/dL   GFR calc non Af Amer 70 >59 mL/min/1.73   GFR calc Af  Amer 81 >59 mL/min/1.73   BUN/Creatinine Ratio 13 12 - 28   Sodium 144 134 - 144 mmol/L   Potassium 4.2 3.5 - 5.2 mmol/L   Chloride 102 96 - 106 mmol/L  CO2 22 20 - 29 mmol/L   Calcium 9.9 8.7 - 10.3 mg/dL   Total Protein 6.4 6.0 - 8.5 g/dL   Albumin 4.5 3.8 - 4.8 g/dL   Globulin, Total 1.9 1.5 - 4.5 g/dL   Albumin/Globulin Ratio 2.4 (H) 1.2 - 2.2   Bilirubin Total 0.3 0.0 - 1.2 mg/dL   Alkaline Phosphatase 58 39 - 117 IU/L   AST 19 0 - 40 IU/L   ALT 26 0 - 32 IU/L  TSH  Result Value Ref Range   TSH 1.800 0.450 - 4.500 uIU/mL  Lipid Panel w/o Chol/HDL Ratio  Result Value Ref Range   Cholesterol, Total 240 (H) 100 - 199 mg/dL   Triglycerides 88 0 - 149 mg/dL   HDL 75 >71 mg/dL   VLDL Cholesterol Cal 15 5 - 40 mg/dL   LDL Chol Calc (NIH) 696 (H) 0 - 99 mg/dL  VITAMIN D 25 Hydroxy (Vit-D Deficiency, Fractures)  Result Value Ref Range   Vit D, 25-Hydroxy 36.4 30.0 - 100.0 ng/mL      Assessment & Plan:   Problem List Items Addressed This Visit      Respiratory   Aspiration pneumonia of right lower lobe due to regurgitated food (HCC) - Primary    Acute with ongoing cough.  Will extend Augmentin, as was taking this with Tetracycline (which she is about to complete) and this can lower concentration levels Augmentin.  Extend for 7 days for PNA.  Recommend deep breathing exercises at home.  Cough syrup script sent.  Encourage lots of rest and fluids at home.  Plan to repeat CXR prior to next visit.  Return in 4 weeks, sooner if worsening symptoms.      Relevant Medications   tetracycline (SUMYCIN) 500 MG capsule   amoxicillin-clavulanate (AUGMENTIN) 875-125 MG tablet   guaiFENesin-codeine 100-10 MG/5ML syrup   Other Relevant Orders   DG Chest 2 View     Digestive   Esophageal dysphagia    New diagnosis, continue collaboration with GI at [facility] and reviewed recent notes.          Follow up plan: Return in about 4 weeks (around 01/20/2021) for PNA.

## 2020-12-23 NOTE — Patient Instructions (Signed)
Imaging -- [address] [place], [place] [address]   Aspiration Pneumonia, Adult  Aspiration pneumonia is an infection that occurs after lung (pulmonary) aspiration. Pulmonary aspiration is when you inhale a large amount of food, liquid, stomach acid, or saliva into the lungs. This can cause inflammation and infection in the lungs. This can make you cough and make it hard to breathe. Aspiration pneumonia is a serious condition and can be life-threatening. What are the causes? This condition may be caused by:  Bacteria in food, liquid, stomach acid, or saliva that is inhaled into the lung.  Irritation and inflammation that results from material, such as blood or a foreign body, being inhaled into the lung. This can lead to an infection even though the material is not originally contaminated with bacteria. What increases the risk? You are more likely to get aspiration pneumonia if you have a condition that makes it hard to breathe, swallow, cough, or gag. These conditions may include:  A breathing disorder, such as chronic obstructive pulmonary disease, that makes it hard to eat or drink while breathing.  A brain (neurologic) disorder, such as stroke, seizures, Parkinson's disease, dementia, amyotrophic lateral sclerosis (ALS), or brain injury.  Having gastroesophageal reflux disease (GERD).  Having a weak disease-fighting system (immune system).  Having a narrowing of the tube that carries food to the stomach (esophageal narrowing). Other factors that may make you more likely to get aspiration pneumonia include:  Being older than age 31 and frail.  Being given a general anesthetic for procedures.  Drinking too much alcohol and passing out. If you pass out and vomit, then vomit can be inhaled into your lungs.  Taking certain medicines, such as tranquilizers or sedatives.  Taking poor care of your mouth and teeth.  Being malnourished. What are the signs or symptoms? The main symptom of  pulmonary aspiration may be an episode of choking or coughing while eating or drinking. When aspiration pneumonia develops, symptoms include:  Persistent cough.  Difficulty breathing, such as wheezing or shortness of breath.  Fever.  Chest pain.  Being more tired than usual (fatigue). Pulmonary aspiration may be silent, meaning that it is not associated with coughing or choking while eating or drinking. How is this diagnosed? This condition may be diagnosed based on:  A physical exam.  Tests, such as: ? A chest X-ray. ? A sputum culture. Saliva and mucus (sputum) are collected from the lungs or the tubes that carry air to the lungs (bronchi). The sputum is then tested for bacteria. ? Oximetry. A sensor or clip is placed on areas such as a finger, earlobe, or toe to measure the oxygen level in your blood. ? Blood tests. ? A swallowing study. This test looks at how food is swallowed and whether it goes into your windpipe (trachea) or esophagus. ? A bronchoscopy. This test uses a flexible tube (bronchoscope) to see inside the lungs. How is this treated? This condition may be treated with:  Medicines. Antibiotic medicine will be given to kill the pneumonia bacteria. Other medicines may also be used to reduce fever, pain, or inflammation.  Breathing assistance and oxygen therapy. Depending on how well you are breathing, you may need to be given oxygen, or you may need breathing support from a breathing machine (ventilator).  Thoracentesis. This is a procedure to remove fluid that has built up in the space between the linings of the chest wall and the lungs.  Dietary changes. You may need to avoid certain  food textures or liquids. For people who have recurrent aspiration pneumonia, a feeding tube might be placed in the stomach for nutrition. Follow these instructions at home: Medicines  Take over-the-counter and prescription medicines only as told by your health care provider.  If  you were prescribed an antibiotic medicine, take it as told by your health care provider. Do not stop taking the antibiotic even if you start to feel better.  Take cough medicine only if you are losing sleep. Cough medicine can prevent your body's natural ability to remove mucus from your lungs.   General instructions  Carefully follow any eating instructions you were given, such as avoiding certain food textures or thickening your liquids. Thickening liquids reduces the risk of developing aspiration pneumonia again.  Return to normal activities as told by your health care provider. Ask your health care provider what activities are safe for you.  Sleep in a semi-upright position at night. Try to sleep in a reclining chair, or place a few pillows under your head in bed.  Do not use any products that contain nicotine or tobacco, such as cigarettes, e-cigarettes, and chewing tobacco. If you need help quitting, ask your health care provider.  Keep all follow-up visits as told by your health care provider. This is important.   Contact a health care provider if you:  Have a fever.  Are coughing or choking while eating or drinking.  Continue to have signs or symptoms of aspiration pneumonia. Get help right away if you have:  Worsening shortness of breath, wheezing, or difficulty breathing.  Chest pain. Summary  Aspiration pneumonia is an infection that occurs after lung (pulmonary) aspiration. Pulmonary aspiration is when you inhale a large amount of food, liquid, stomach acid, or saliva into the lungs.  The main symptom of pulmonary aspiration may be an episode of choking or coughing. It may also be silent without coughing or choking.  You are more likely to get aspiration pneumonia if you have a condition that makes it hard to breathe, swallow, cough, or gag. This information is not intended to replace advice given to you by your health care provider. Make sure you discuss any questions  you have with your health care provider. Document Revised: 11/02/2019 Document Reviewed: 11/02/2019 Elsevier Patient Education  2021 [employer].

## 2020-12-23 NOTE — Assessment & Plan Note (Signed)
New diagnosis, continue collaboration with GI at [facility] and reviewed recent notes. 

## 2021-01-20 ENCOUNTER — Ambulatory Visit
Admission: RE | Admit: 2021-01-20 | Discharge: 2021-01-20 | Disposition: A | Discharge status: Home / Self Care | Payer: Medicare Other | Source: Home / Self Care | Attending: Nurse Practitioner | Admitting: Nurse Practitioner

## 2021-01-20 ENCOUNTER — Ambulatory Visit (INDEPENDENT_AMBULATORY_CARE_PROVIDER_SITE_OTHER): Payer: Medicare Other | Admitting: Nurse Practitioner

## 2021-01-20 ENCOUNTER — Ambulatory Visit
Admission: RE | Admit: 2021-01-20 | Discharge: 2021-01-20 | Disposition: A | Discharge status: Home / Self Care | Payer: Medicare Other | Source: Ambulatory Visit | Attending: Nurse Practitioner | Admitting: Nurse Practitioner

## 2021-01-20 ENCOUNTER — Encounter: Payer: Self-pay | Admitting: Nurse Practitioner

## 2021-01-20 ENCOUNTER — Other Ambulatory Visit: Payer: Self-pay

## 2021-01-20 VITALS — BP 135/90 | HR 64 | Temp 97.6°F | Wt 155.0 lb

## 2021-01-20 DIAGNOSIS — N760 Acute vaginitis: Secondary | ICD-10-CM | POA: Diagnosis not present

## 2021-01-20 DIAGNOSIS — J690 Pneumonitis due to inhalation of food and vomit: Secondary | ICD-10-CM

## 2021-01-20 DIAGNOSIS — B9689 Other specified bacterial agents as the cause of diseases classified elsewhere: Secondary | ICD-10-CM | POA: Insufficient documentation

## 2021-01-20 DIAGNOSIS — R1319 Other dysphagia: Secondary | ICD-10-CM | POA: Diagnosis not present

## 2021-01-20 DIAGNOSIS — F419 Anxiety disorder, unspecified: Secondary | ICD-10-CM

## 2021-01-20 LAB — URINALYSIS, ROUTINE W REFLEX MICROSCOPIC
Bilirubin, UA: NEGATIVE
Glucose, UA: NEGATIVE
Ketones, UA: NEGATIVE
Nitrite, UA: NEGATIVE
Protein,UA: NEGATIVE
RBC, UA: NEGATIVE
Specific Gravity, UA: 1.01 (ref 1.005–1.030)
Urobilinogen, Ur: 0.2 mg/dL (ref 0.2–1.0)
pH, UA: 5.5 (ref 5.0–7.5)

## 2021-01-20 LAB — WET PREP FOR TRICH, YEAST, CLUE
Clue Cell Exam: POSITIVE — AB
Trichomonas Exam: NEGATIVE
Yeast Exam: NEGATIVE

## 2021-01-20 LAB — MICROSCOPIC EXAMINATION: RBC, Urine: NONE SEEN /hpf (ref 0–2)

## 2021-01-20 MED ORDER — METRONIDAZOLE 0.75 % VA GEL: 1.0000 | Freq: Two times a day (BID) | VAGINAL | 0 refills | Status: DC

## 2021-01-20 MED ORDER — LORAZEPAM 1 MG PO TABS
1.0000 mg | ORAL_TABLET | Freq: Every evening | ORAL | 0 refills | Status: DC | PRN
Start: 2021-01-20 — End: 2021-08-25

## 2021-01-20 NOTE — Assessment & Plan Note (Signed)
Acute and improving.  Recommend continue deep breathing exercises at home. Encourage lots of rest and fluids at home.  Plan to repeat CXR, order sent and discussed with patient.  Return as scheduled in upcoming weeks for physical.

## 2021-01-20 NOTE — Progress Notes (Signed)
BP 135/90   Pulse 64   Temp 97.6 F (36.4 C) (Oral)   Wt 155 lb (70.3 kg)   LMP  (LMP Unknown)   SpO2 97%   BMI 25.39 kg/m    Subjective:    Patient ID: Kristen May, female    DOB: Oct 30, 1951, 70 y.o.   MRN: [id number]  HPI: Kristen May is a 70 y.o. female  Chief Complaint  Patient presents with  . Urinary Tract Infection    Patient complains of some burning when she urinates and patient thinks that it may just be irritation.   . Medication Refill    Patient is requesting a refill on her medications.   PNEUMONIA Was in [facility] on 12/06/20 and had endoscopy on 12/07/20 for esophageal dysphagia -- had some food impaction and they did not observe motility they noticed, so is having further testing.  Was treated for ulcers and H. Pylori infection -- 12/19/20 with Tetracycline.  Treated for cough on 12/11/20 at urgent care with Augmentin for food aspiration PNA -- just completed this course.  Imaging noted "Hazy right lower lobe and inferior right upper lobe airspace opacities".  She is not scheduled to see GI physician until March 30th.  Had motility testing on 12/30/20 -- hiatal hernia.   Reports cough has improved at this time. Recurrent headaches: no Visual changes: no Palpitations: no Dyspnea: no Chest pain: no Lower extremity edema: no Dizzy/lightheaded: no   URINARY SYMPTOMS Started about 10 days ago. Dysuria: burning Urinary frequency: yes Urgency: a little bit Small volume voids: no Symptom severity: no Urinary incontinence: no Foul odor: no Hematuria: no Abdominal pain: no Back pain: no Suprapubic pain/pressure: no Flank pain: no Fever:  no Vomiting: no Status: stable Previous urinary tract infection: yes Recurrent urinary tract infection: no Sexual activity: monogomous History of sexually transmitted disease: long time ago herpes Treatments attempted: increasing fluids   DEPRESSION Currently takes occasional Ativan, has been on for a long while.  She rarely takes  this, gets refills once a year #30 pills -- last fill 01/17/2020.  Pt is aware of risks of benzo medication use to include increased sedation, respiratory suppression, falls, dependence and cardiovascular events. Pt would like to continue treatment as benefit determined to outweigh risk.   Mood status: stable Satisfied with current treatment?: yes Symptom severity: mild  Duration of current treatment : chronic Side effects: no Medication compliance: good compliance Psychotherapy/counseling: none Depressed mood: no Anxious mood: no Anhedonia: no Significant weight loss or gain: no Insomnia: yes hard to fall asleep Fatigue: no Feelings of worthlessness or guilt: no Impaired concentration/indecisiveness: no Suicidal ideations: no Hopelessness: no Crying spells: no Depression screen [facility] 2/9 12/23/2020 03/07/2020 08/24/2017 08/23/2016  Decreased Interest 0 0 0 0  Down, Depressed, Hopeless 0 0 0 0  PHQ - 2 Score 0 0 0 0  Altered sleeping - - 0 0  Tired, decreased energy - - 0 0  Change in appetite - - 0 0  Feeling bad or failure about yourself  - - 0 0  Trouble concentrating - - 0 0  Moving slowly or fidgety/restless - - 0 0  Suicidal thoughts - - 0 0  PHQ-9 Score - - 0 0    Relevant past medical, surgical, family and social history reviewed and updated as indicated. Interim medical history since our last visit reviewed. Allergies and medications reviewed and updated.  Review of Systems  Constitutional: Negative for activity change, appetite change, diaphoresis, fatigue and fever.  Respiratory: Negative for cough, chest tightness and shortness of breath.   Cardiovascular: Negative for chest pain, palpitations and leg swelling.  Genitourinary: Positive for dysuria, frequency and urgency. Negative for decreased urine volume, hematuria, vaginal bleeding, vaginal discharge and vaginal pain.  Neurological: Negative.   Psychiatric/Behavioral: Negative.     Per HPI unless specifically  indicated above     Objective:    BP 135/90   Pulse 64   Temp 97.6 F (36.4 C) (Oral)   Wt 155 lb (70.3 kg)   LMP  (LMP Unknown)   SpO2 97%   BMI 25.39 kg/m   Wt Readings from Last 3 Encounters:  01/20/21 155 lb (70.3 kg)  12/23/20 162 lb 12.8 oz (73.8 kg)  03/07/20 155 lb (70.3 kg)    Physical Exam Vitals and nursing note reviewed.  Constitutional:      General: She is awake. She is not in acute distress.    Appearance: She is well-developed and well-groomed. She is not ill-appearing or toxic-appearing.  HENT:     Head: Normocephalic.     Right Ear: Hearing normal.     Left Ear: Hearing normal.     Nose: Nose normal.  Eyes:     General: Lids are normal.        Right eye: No discharge.        Left eye: No discharge.     Conjunctiva/sclera: Conjunctivae normal.     Pupils: Pupils are equal, round, and reactive to light.  Neck:     Thyroid: No thyromegaly.     Vascular: No carotid bruit.  Cardiovascular:     Rate and Rhythm: Normal rate and regular rhythm.     Heart sounds: Normal heart sounds. No murmur heard. No gallop.   Pulmonary:     Effort: Pulmonary effort is normal. No accessory muscle usage or respiratory distress.     Breath sounds: Normal breath sounds.     Comments: Clear throughout all fields. Abdominal:     General: Bowel sounds are normal.     Palpations: Abdomen is soft.  Musculoskeletal:     Cervical back: Normal range of motion and neck supple.     Right lower leg: No edema.     Left lower leg: No edema.  Lymphadenopathy:     Cervical: No cervical adenopathy.  Skin:    General: Skin is warm and dry.  Neurological:     Mental Status: She is alert and oriented to person, place, and time.  Psychiatric:        Attention and Perception: Attention normal.        Mood and Affect: Mood normal.        Speech: Speech normal.        Behavior: Behavior normal. Behavior is cooperative.        Thought Content: Thought content normal.    Results for  orders placed or performed in visit on 03/07/20  [id number] 11+Oxyco+Alc+Crt-Bund  Result Value Ref Range   Ethanol Negative Cutoff=0.020 %   Amphetamines, Urine Negative Cutoff=1000 ng/mL   Barbiturate Negative Cutoff=200 ng/mL   BENZODIAZ UR QL Negative Cutoff=200 ng/mL   Cannabinoid Quant, Ur Negative Cutoff=50 ng/mL   Cocaine (Metabolite) Negative Cutoff=300 ng/mL   OPIATE SCREEN URINE Negative Cutoff=300 ng/mL   Oxycodone/Oxymorphone, Urine Negative Cutoff=300 ng/mL   Phencyclidine Negative Cutoff=25 ng/mL   Methadone Screen, Urine Negative Cutoff=300 ng/mL   Propoxyphene Negative Cutoff=300 ng/mL   Meperidine Negative Cutoff=200 ng/mL   Tramadol Negative Cutoff=200  ng/mL   Creatinine 123.5 20.0 - 300.0 mg/dL   pH, Urine 6.1 4.5 - 8.9  CBC with Differential/Platelet  Result Value Ref Range   WBC 7.0 3.4 - 10.8 x10E3/uL   RBC 4.25 3.77 - 5.28 x10E6/uL   Hemoglobin 13.8 11.1 - 15.9 g/dL   Hematocrit 37.6 28.3 - 46.6 %   MCV 97 79 - 97 fL   MCH 32.5 26.6 - 33.0 pg   MCHC 33.4 31.5 - 35.7 g/dL   RDW 15.1 76.1 - 60.7 %   Platelets 306 150 - 450 x10E3/uL   Neutrophils 49 Not Estab. %   Lymphs 36 Not Estab. %   Monocytes 9 Not Estab. %   Eos 5 Not Estab. %   Basos 1 Not Estab. %   Neutrophils Absolute 3.4 1.4 - 7.0 x10E3/uL   Lymphocytes Absolute 2.5 0.7 - 3.1 x10E3/uL   Monocytes Absolute 0.6 0.1 - 0.9 x10E3/uL   EOS (ABSOLUTE) 0.4 0.0 - 0.4 x10E3/uL   Basophils Absolute 0.1 0.0 - 0.2 x10E3/uL   Immature Granulocytes 0 Not Estab. %   Immature Grans (Abs) 0.0 0.0 - 0.1 x10E3/uL  Comprehensive metabolic panel  Result Value Ref Range   Glucose 88 65 - 99 mg/dL   BUN 11 8 - 27 mg/dL   Creatinine, Ser 3.71 0.57 - 1.00 mg/dL   GFR calc non Af Amer 70 >59 mL/min/1.73   GFR calc Af Amer 81 >59 mL/min/1.73   BUN/Creatinine Ratio 13 12 - 28   Sodium 144 134 - 144 mmol/L   Potassium 4.2 3.5 - 5.2 mmol/L   Chloride 102 96 - 106 mmol/L   CO2 22 20 - 29 mmol/L   Calcium 9.9 8.7 -  10.3 mg/dL   Total Protein 6.4 6.0 - 8.5 g/dL   Albumin 4.5 3.8 - 4.8 g/dL   Globulin, Total 1.9 1.5 - 4.5 g/dL   Albumin/Globulin Ratio 2.4 (H) 1.2 - 2.2   Bilirubin Total 0.3 0.0 - 1.2 mg/dL   Alkaline Phosphatase 58 39 - 117 IU/L   AST 19 0 - 40 IU/L   ALT 26 0 - 32 IU/L  TSH  Result Value Ref Range   TSH 1.800 0.450 - 4.500 uIU/mL  Lipid Panel w/o Chol/HDL Ratio  Result Value Ref Range   Cholesterol, Total 240 (H) 100 - 199 mg/dL   Triglycerides 88 0 - 149 mg/dL   HDL 75 >06 mg/dL   VLDL Cholesterol Cal 15 5 - 40 mg/dL   LDL Chol Calc (NIH) 269 (H) 0 - 99 mg/dL  VITAMIN D 25 Hydroxy (Vit-D Deficiency, Fractures)  Result Value Ref Range   Vit D, 25-Hydroxy 36.4 30.0 - 100.0 ng/mL      Assessment & Plan:   Problem List Items Addressed This Visit      Respiratory   Aspiration pneumonia of right lower lobe due to regurgitated food (HCC) - Primary    Acute and improving.  Recommend continue deep breathing exercises at home. Encourage lots of rest and fluids at home.  Plan to repeat CXR, order sent and discussed with patient.  Return as scheduled in upcoming weeks for physical.      Relevant Orders   CBC with Differential/Platelet   Comprehensive metabolic panel   DG Chest 2 View     Digestive   Esophageal dysphagia    New diagnosis, continue collaboration with GI at [facility] and reviewed recent notes.      Relevant Orders   Comprehensive metabolic panel  Genitourinary   Bacterial vaginosis    Acute over past 10 days with symptoms, UA clear with exception of LEUKS and bacteria.  Wet prep noting clue cells, negative yeast and trich.  Would prefer to use vaginal gel vs oral medication for treatment.  Will send in vaginal Metrogel to use x 7 days.  If ongoing symptoms return to office.      Relevant Orders   Urinalysis, Routine w reflex microscopic   WET PREP FOR TRICH, YEAST, CLUE     Other   Anxiety    Chronic, stable with minimal use of Ativan.  #30 pills lasts  one year on PDMP review and discussion with patient.  She is aware of risks of benzo use chronically.  UDS next visit, obtain contract next visit.  Denies SI/HI.      Relevant Medications   LORazepam (ATIVAN) 1 MG tablet       Follow up plan: Return if symptoms worsen or fail to improve.

## 2021-01-20 NOTE — Patient Instructions (Signed)
Chest x-ray location -- [address], [place] [address]  Community-Acquired Pneumonia, Adult Pneumonia is an infection of the lungs. It causes irritation and swelling in the airways of the lungs. Mucus and fluid may also build up inside the airways. This may cause coughing and trouble breathing. One type of pneumonia can happen while you are in a hospital. A different type can happen when you are not in a hospital (community-acquired pneumonia). What are the causes? This condition is caused by germs (viruses, bacteria, or fungi). Some types of germs can spread from person to person. Pneumonia is not thought to spread from person to person.   What increases the risk? You are more likely to develop this condition if:  You have a long-term (chronic) disease, such as: ? Disease of the lungs. This may be chronic obstructive pulmonary disease (COPD) or asthma. ? Heart failure. ? Cystic fibrosis. ? Diabetes. ? Kidney disease. ? Sickle cell disease. ? HIV.  You have other health problems, such as: ? Your body's defense system (immune system) is weak. ? A condition that may cause you to breathe in fluids from your mouth and nose.  You had your spleen taken out.  You do not take good care of your teeth and mouth (poor dental hygiene).  You use or have used tobacco products.  You travel where the germs that cause this illness are common.  You are near certain animals or the places they live.  You are older than 69 years of age. What are the signs or symptoms? Symptoms of this condition include:  A cough.  A fever.  Sweating or chills.  Chest pain, often when you breathe deeply or cough.  Breathing problems, such as: ? Fast breathing. ? Trouble breathing. ? Shortness of breath.  Feeling tired (fatigued).  Muscle aches. How is this treated? Treatment for this condition depends on many things, such as:  The cause of your illness.  Your medicines.  Your other health  problems. Most adults can be treated at home. Sometimes, treatment must happen in a hospital.  Treatment may include medicines to kill germs.  Medicines may depend on which germ caused your illness. Very bad pneumonia is rare. If you get it, you may:  Have a machine to help you breathe.  Have fluid taken away from around your lungs. Follow these instructions at home: Medicines  Take over-the-counter and prescription medicines only as told by your doctor.  Take cough medicine only if you are losing sleep. Cough medicine can keep your body from taking mucus away from your lungs.  If you were prescribed an antibiotic medicine, take it as told by your doctor. Do not stop taking the antibiotic even if you start to feel better. Lifestyle  Do not drink alcohol.  Do not use any products that contain nicotine or tobacco, such as cigarettes, e-cigarettes, and chewing tobacco. If you need help quitting, ask your doctor.  Eat a healthy diet. This includes a lot of vegetables, fruits, whole grains, low-fat dairy products, and low-fat (lean) protein.      General instructions  Rest a lot. Sleep for at least 8 hours each night.  Sleep with your head and neck raised. Put a few pillows under your head or sleep in a reclining chair.  Return to your normal activities as told by your doctor. Ask your doctor what activities are safe for you.  Drink enough fluid to keep your pee (urine) pale yellow.  If your throat  is sore, rinse your mouth often with salt water. To make salt water, dissolve -1 tsp (3-6 g) of salt in 1 cup (237 mL) of warm water.  Keep all follow-up visits as told by your doctor. This is important.   How is this prevented? You can lower your risk of pneumonia by:  Getting the pneumonia shot (vaccine). These shots have different types and schedules. Ask your doctor what works best for you. Think about getting this shot if: ? You are older than 69 years of age. ? You are 13-45  years of age and:  You are being treated for cancer.  You have long-term lung disease.  You have other problems that affect your body's defense system. Ask your doctor if you have one of these.  Getting your flu shot every year. Ask your doctor which type of shot is best for you.  Going to the dentist as often as told.  Washing your hands often with soap and water for at least 20 seconds. If you cannot use soap and water, use hand sanitizer. Contact a doctor if:  You have a fever.  You lose sleep because your cough medicine does not help. Get help right away if:  You are short of breath and this gets worse.  You have more chest pain.  Your sickness gets worse. This is very serious if: ? You are an older adult. ? Your body's defense system is weak.  You cough up blood. These symptoms may be an emergency. Do not wait to see if the symptoms will go away. Get medical help right away. Call your local emergency services (911 in the [place] Do not drive yourself to the hospital. Summary  Pneumonia is an infection of the lungs.  Community-acquired pneumonia affects people who have not been in the hospital. Certain germs can cause this infection.  This condition may be treated with medicines that kill germs.  For very bad pneumonia, you may need a hospital stay and treatment to help with breathing. This information is not intended to replace advice given to you by your health care provider. Make sure you discuss any questions you have with your health care provider. Document Revised: 08/14/2019 Document Reviewed: 08/14/2019 Elsevier Patient Education  2021 [employer].

## 2021-01-20 NOTE — Assessment & Plan Note (Signed)
Chronic, stable with minimal use of Ativan.  #30 pills lasts one year on PDMP review and discussion with patient.  She is aware of risks of benzo use chronically.  UDS next visit, obtain contract next visit.  Denies SI/HI.

## 2021-01-20 NOTE — Assessment & Plan Note (Signed)
New diagnosis, continue collaboration with GI at [facility] and reviewed recent notes.

## 2021-01-20 NOTE — Assessment & Plan Note (Signed)
Acute over past 10 days with symptoms, UA clear with exception of LEUKS and bacteria.  Wet prep noting clue cells, negative yeast and trich.  Would prefer to use vaginal gel vs oral medication for treatment.  Will send in vaginal Metrogel to use x 7 days.  If ongoing symptoms return to office.

## 2021-01-21 LAB — COMPREHENSIVE METABOLIC PANEL
ALT: 36 IU/L — ABNORMAL HIGH (ref 0–32)
AST: 21 IU/L (ref 0–40)
Albumin/Globulin Ratio: 1.8 (ref 1.2–2.2)
Albumin: 4.6 g/dL (ref 3.8–4.8)
Alkaline Phosphatase: 63 IU/L (ref 44–121)
BUN/Creatinine Ratio: 15 (ref 12–28)
BUN: 12 mg/dL (ref 8–27)
Bilirubin Total: 0.4 mg/dL (ref 0.0–1.2)
CO2: 20 mmol/L (ref 20–29)
Calcium: 9.7 mg/dL (ref 8.7–10.3)
Chloride: 104 mmol/L (ref 96–106)
Creatinine, Ser: 0.82 mg/dL (ref 0.57–1.00)
Globulin, Total: 2.5 g/dL (ref 1.5–4.5)
Glucose: 80 mg/dL (ref 65–99)
Potassium: 4.3 mmol/L (ref 3.5–5.2)
Sodium: 141 mmol/L (ref 134–144)
Total Protein: 7.1 g/dL (ref 6.0–8.5)
eGFR: 78 mL/min/{1.73_m2} (ref >59)

## 2021-01-21 LAB — CBC WITH DIFFERENTIAL/PLATELET
Basophils Absolute: 0.1 10*3/uL (ref 0.0–0.2)
Basos: 1 %
EOS (ABSOLUTE): 0.3 10*3/uL (ref 0.0–0.4)
Eos: 4 %
Hematocrit: 41.1 % (ref 34.0–46.6)
Hemoglobin: 14.3 g/dL (ref 11.1–15.9)
Immature Grans (Abs): 0 10*3/uL (ref 0.0–0.1)
Immature Granulocytes: 0 %
Lymphocytes Absolute: 2.6 10*3/uL (ref 0.7–3.1)
Lymphs: 39 %
MCH: 33.1 pg — ABNORMAL HIGH (ref 26.6–33.0)
MCHC: 34.8 g/dL (ref 31.5–35.7)
MCV: 95 fL (ref 79–97)
Monocytes Absolute: 0.5 10*3/uL (ref 0.1–0.9)
Monocytes: 7 %
Neutrophils Absolute: 3.3 10*3/uL (ref 1.4–7.0)
Neutrophils: 49 %
Platelets: 309 10*3/uL (ref 150–450)
RBC: 4.32 x10E6/uL (ref 3.77–5.28)
RDW: 11.8 % (ref 11.7–15.4)
WBC: 6.9 10*3/uL (ref 3.4–10.8)

## 2021-01-21 NOTE — Progress Notes (Signed)
Contacted via MyChart   Good evening Kristen May, your blood work has returned and this remains stable.  No major concerns noted.  I would continue deep breathing exercises and good hydration daily + follow-up with GI at [facility].  Any questions? Keep being awesome!!  Thank you for allowing me to participate in your care. Kindest regards, Jolene

## 2021-01-21 NOTE — Progress Notes (Signed)
Contacted via MyChart   Chest x-ray is clear, no pneumonia noted!!  Great news!!

## 2021-02-04 ENCOUNTER — Other Ambulatory Visit: Payer: Self-pay

## 2021-02-04 ENCOUNTER — Encounter: Payer: Self-pay | Admitting: Nurse Practitioner

## 2021-02-04 ENCOUNTER — Ambulatory Visit (INDEPENDENT_AMBULATORY_CARE_PROVIDER_SITE_OTHER): Payer: Medicare Other | Admitting: Nurse Practitioner

## 2021-02-04 VITALS — BP 122/76 | HR 58 | Temp 97.7°F | Wt 157.8 lb

## 2021-02-04 DIAGNOSIS — N760 Acute vaginitis: Secondary | ICD-10-CM | POA: Diagnosis not present

## 2021-02-04 DIAGNOSIS — B9689 Other specified bacterial agents as the cause of diseases classified elsewhere: Secondary | ICD-10-CM

## 2021-02-04 LAB — MICROSCOPIC EXAMINATION: RBC, Urine: NONE SEEN /hpf (ref 0–2)

## 2021-02-04 LAB — URINALYSIS, ROUTINE W REFLEX MICROSCOPIC
Bilirubin, UA: NEGATIVE
Glucose, UA: NEGATIVE
Ketones, UA: NEGATIVE
Nitrite, UA: NEGATIVE
Protein,UA: NEGATIVE
RBC, UA: NEGATIVE
Specific Gravity, UA: 1.01 (ref 1.005–1.030)
Urobilinogen, Ur: 0.2 mg/dL (ref 0.2–1.0)
pH, UA: 7 (ref 5.0–7.5)

## 2021-02-04 LAB — WET PREP FOR TRICH, YEAST, CLUE
Clue Cell Exam: POSITIVE — AB
Trichomonas Exam: NEGATIVE
Yeast Exam: NEGATIVE

## 2021-02-04 MED ORDER — CLINDAMYCIN HCL 300 MG PO CAPS: 300.0000 mg | ORAL_CAPSULE | Freq: Two times a day (BID) | ORAL | 0 refills | Status: AC

## 2021-02-04 NOTE — Assessment & Plan Note (Signed)
Ongoing symptoms post Metrogel treatment, UA clear with exception of LEUKS and bacteria.  Wet prep noting clue cells, negative yeast and trich.  Would prefer not to use oral Flagyl as taste makes her nauseous.  Will send in Clindamycin 300 MG BID x 7 days, recommend she take this with probiotic yogurt or pill for GI health.  If ongoing symptoms return to office immediately.  Educated on BV.

## 2021-02-04 NOTE — Patient Instructions (Signed)
Bacterial Vaginosis  Bacterial vaginosis is an infection of the vagina. It happens when too many normal germs (healthy bacteria) grow in the vagina. This infection can make it easier to get other infections from sex (STIs). It is very important for pregnant women to get treated. This infection can cause babies to be born early or at a low birth weight. What are the causes? This infection is caused by an increase in certain germs that grow in the vagina. You cannot get this infection from toilet seats, bedsheets, swimming pools, or things that touch your vagina. What increases the risk?  Having sex with a new person or more than one person.  Having sex without protection.  Douching.  Having an intrauterine device (IUD).  Smoking.  Using drugs or drinking alcohol. These can lead you to do things that are risky.  Taking certain antibiotic medicines.  Being pregnant. What are the signs or symptoms? Some women have no symptoms. Symptoms may include:  A discharge from your vagina. It may be gray or white. It can be watery or foamy.  A fishy smell. This can happen after sex or during your menstrual period.  Itching in and around your vagina.  A feeling of burning or pain when you pee (urinate). How is this treated? This infection is treated with antibiotic medicines. These may be given to you as:  A pill.  A cream for your vagina.  A medicine that you put into your vagina (suppository). If the infection comes back after treatment, you may need more antibiotics. Follow these instructions at home: Medicines  Take over-the-counter and prescription medicines as told by your doctor.  Take or use your antibiotic medicine as told by your doctor. Do not stop taking or using it, even if you start to feel better. General instructions  If the person you have sex with is a woman, tell her that you have this infection. She will need to follow up with her doctor. If you have a female  partner, he does not need to be treated.  Do not have sex until you finish treatment.  Drink enough fluid to keep your pee pale yellow.  Keep your vagina and butt clean. ? Wash the area with warm water each day. ? Wipe from front to back after you use the toilet.  If you are breastfeeding a baby, ask your doctor if you should keep doing so during treatment.  Keep all follow-up visits. How is this prevented? Self-care  Do not douche.  Use only warm water to wash around your vagina.  Wear underwear that is cotton or lined with cotton.  Do not wear tight pants and pantyhose, especially in the summer. Safe sex  Use protection when you have sex. This includes: ? Use condoms. ? Use dental dams. This is a thin layer that protects the mouth during oral sex.  Limit how many people you have sex with. To prevent this infection, it is best to have sex with just one person.  Get tested for STIs. The person you have sex with should also get tested. Drugs and alcohol  Do not smoke or use any products that contain nicotine or tobacco. If you need help quitting, ask your doctor.  Do not use drugs.  Do not drink alcohol if: ? Your doctor tells you not to drink. ? You are pregnant, may be pregnant, or are planning to become pregnant.  If you drink alcohol: ? Limit how much you have to 0-1 drink   a day. ? Know how much alcohol is in your drink. In the [place] one drink equals one 12 oz bottle of beer (355 mL), one 5 oz glass of wine (148 mL), or one 1 oz glass of hard liquor (44 mL). Where to find more information  Centers for Disease Control and Prevention: www.cdc.gov  American Sexual Health Association: www.ashastd.org  Office on Women's Health: www.womenshealth.gov Contact a doctor if:  Your symptoms do not get better, even after you are treated.  You have more discharge or pain when you pee.  You have a fever or chills.  You have pain in your belly (abdomen) or in the area  between your hips.  You have pain with sex.  You bleed from your vagina between menstrual periods. Summary  This infection can happen when too many germs (bacteria) grow in the vagina.  This infection can make it easier to get infections from sex (STIs). Treating this can lower that chance.  Get treated if you are pregnant. This infection can cause babies to be born early.  Do not stop taking or using your antibiotic medicine, even if you start to feel better. This information is not intended to replace advice given to you by your health care provider. Make sure you discuss any questions you have with your health care provider. Document Revised: 05/01/2020 Document Reviewed: 05/01/2020 Elsevier Patient Education  2021 Elsevier Inc.  

## 2021-02-04 NOTE — Progress Notes (Signed)
BP 122/76   Pulse (!) 58   Temp 97.7 F (36.5 C) (Oral)   Wt 157 lb 12.8 oz (71.6 kg)   LMP  (LMP Unknown)   SpO2 99%   BMI 25.85 kg/m    Subjective:    Patient ID: Kristen May, female    DOB: 06-Oct-1951, 70 y.o.   MRN: [id number]  HPI: Kristen May is a 69 y.o. female  Chief Complaint  Patient presents with  . Vaginitis    Patient states she is still experiencing burning with urination. Patient would like to discuss getting her Booster vaccine.   VAGINAL DISCHARGE Treated on 01/20/21 for bacterial vaginosis presenting post abx treatment for pneumonia.  Was treated with Metrogel. 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 Interim medical history since our last visit reviewed. Allergies and medications reviewed and updated.  Review of Systems  Constitutional: Negative for activity change, appetite change, diaphoresis, fatigue and fever.  Respiratory: Negative for cough, chest tightness and shortness of breath.   Cardiovascular: Negative for chest pain, palpitations and leg swelling.  Genitourinary: Negative for decreased urine volume, dysuria, frequency, hematuria, urgency, vaginal bleeding, vaginal discharge and vaginal pain.  Psychiatric/Behavioral: Negative.     Per HPI unless specifically indicated above     Objective:    BP 122/76   Pulse (!) 58   Temp 97.7 F (36.5 C) (Oral)   Wt 157 lb 12.8 oz (71.6 kg)   LMP  (LMP Unknown)   SpO2 99%   BMI 25.85 kg/m   Wt Readings from Last 3 Encounters:  02/04/21 157 lb 12.8 oz (71.6 kg)  01/20/21 155 lb (70.3 kg)   12/23/20 162 lb 12.8 oz (73.8 kg)    Physical Exam Vitals and nursing note reviewed.  Constitutional:      General: She is awake. She is not in acute distress.    Appearance: She is well-developed and well-groomed. She is not ill-appearing or toxic-appearing.  HENT:     Head: Normocephalic.     Right Ear: Hearing normal.     Left Ear: Hearing normal.     Nose: Nose normal.  Eyes:     General: Lids are normal.        Right eye: No discharge.        Left eye: No discharge.     Conjunctiva/sclera: Conjunctivae normal.     Pupils: Pupils are equal, round, and reactive to light.  Neck:     Thyroid: No thyromegaly.     Vascular: No carotid bruit.  Cardiovascular:     Rate and Rhythm: Normal rate and regular rhythm.     Heart sounds: Normal heart sounds. No murmur heard. No gallop.   Pulmonary:     Effort: Pulmonary effort is normal. No accessory muscle usage or respiratory distress.     Breath sounds: Normal breath sounds.  Abdominal:     General: Bowel sounds are normal.     Palpations: Abdomen is soft.  Musculoskeletal:     Cervical back: Normal range of motion and neck supple.     Right lower leg: No edema.     Left lower leg: No edema.  Lymphadenopathy:  Cervical: No cervical adenopathy.  Skin:    General: Skin is warm and dry.  Neurological:     Mental Status: She is alert and oriented to person, place, and time.  Psychiatric:        Attention and Perception: Attention normal.        Mood and Affect: Mood normal.        Speech: Speech normal.        Behavior: Behavior normal. Behavior is cooperative.        Thought Content: Thought content normal.    Results for orders placed or performed in visit on 01/20/21  WET PREP FOR [place], YEAST, CLUE   Specimen: Sterile Swab   Sterile Swab  Result Value Ref Range   Trichomonas Exam Negative Negative   Yeast Exam Negative Negative   Clue Cell Exam Positive (A) Negative  Microscopic Examination   Urine  Result  Value Ref Range   WBC, UA 11-30 (A) 0 - 5 /hpf   RBC None seen 0 - 2 /hpf   Epithelial Cells (non renal) 0-10 0 - 10 /hpf   Bacteria, UA Many (A) None seen/Few  Urinalysis, Routine w reflex microscopic  Result Value Ref Range   Specific Gravity, UA 1.010 1.005 - 1.030   pH, UA 5.5 5.0 - 7.5   Color, UA Yellow Yellow   Appearance Ur Cloudy (A) Clear   Leukocytes,UA 2+ (A) Negative   Protein,UA Negative Negative/Trace   Glucose, UA Negative Negative   Ketones, UA Negative Negative   RBC, UA Negative Negative   Bilirubin, UA Negative Negative   Urobilinogen, Ur 0.2 0.2 - 1.0 mg/dL   Nitrite, UA Negative Negative   Microscopic Examination See below:   CBC with Differential/Platelet  Result Value Ref Range   WBC 6.9 3.4 - 10.8 x10E3/uL   RBC 4.32 3.77 - 5.28 x10E6/uL   Hemoglobin 14.3 11.1 - 15.9 g/dL   Hematocrit 41.1 34.0 - 46.6 %   MCV 95 79 - 97 fL   MCH 33.1 (H) 26.6 - 33.0 pg   MCHC 34.8 31.5 - 35.7 g/dL   RDW 11.8 11.7 - 15.4 %   Platelets 309 150 - 450 x10E3/uL   Neutrophils 49 Not Estab. %   Lymphs 39 Not Estab. %   Monocytes 7 Not Estab. %   Eos 4 Not Estab. %   Basos 1 Not Estab. %   Neutrophils Absolute 3.3 1.4 - 7.0 x10E3/uL   Lymphocytes Absolute 2.6 0.7 - 3.1 x10E3/uL   Monocytes Absolute 0.5 0.1 - 0.9 x10E3/uL   EOS (ABSOLUTE) 0.3 0.0 - 0.4 x10E3/uL   Basophils Absolute 0.1 0.0 - 0.2 x10E3/uL   Immature Granulocytes 0 Not Estab. %   Immature Grans (Abs) 0.0 0.0 - 0.1 x10E3/uL  Comprehensive metabolic panel  Result Value Ref Range   Glucose 80 65 - 99 mg/dL   BUN 12 8 - 27 mg/dL   Creatinine, Ser 0.82 0.57 - 1.00 mg/dL   eGFR 78 >59 mL/min/1.73   BUN/Creatinine Ratio 15 12 - 28   Sodium 141 134 - 144 mmol/L   Potassium 4.3 3.5 - 5.2 mmol/L   Chloride 104 96 - 106 mmol/L   CO2 20 20 - 29 mmol/L   Calcium 9.7 8.7 - 10.3 mg/dL   Total Protein 7.1 6.0 - 8.5 g/dL   Albumin 4.6 3.8 - 4.8 g/dL   Globulin, Total 2.5 1.5 - 4.5 g/dL   Albumin/Globulin Ratio  1.8 1.2 - 2.2  Bilirubin Total 0.4 0.0 - 1.2 mg/dL   Alkaline Phosphatase 63 44 - 121 IU/L   AST 21 0 - 40 IU/L   ALT 36 (H) 0 - 32 IU/L      Assessment & Plan:   Problem List Items Addressed This Visit      Genitourinary   Bacterial vaginosis - Primary    Ongoing symptoms post Metrogel treatment, UA clear with exception of LEUKS and bacteria.  Wet prep noting clue cells, negative yeast and trich.  Would prefer not to use oral Flagyl as taste makes her nauseous.  Will send in Clindamycin 300 MG BID x 7 days, recommend she take this with probiotic yogurt or pill for GI health.  If ongoing symptoms return to office immediately.  Educated on [place].      Relevant Medications   clindamycin (CLEOCIN) 300 MG capsule   Other Relevant Orders   Urinalysis, Routine w reflex microscopic   WET PREP FOR TRICH, YEAST, CLUE       Follow up plan: Return if symptoms worsen or fail to improve.

## 2021-02-16 ENCOUNTER — Ambulatory Visit (INDEPENDENT_AMBULATORY_CARE_PROVIDER_SITE_OTHER): Payer: Medicare Other | Admitting: Nurse Practitioner

## 2021-02-16 ENCOUNTER — Encounter: Payer: Self-pay | Admitting: Nurse Practitioner

## 2021-02-16 ENCOUNTER — Other Ambulatory Visit: Payer: Self-pay

## 2021-02-16 VITALS — BP 109/75 | HR 59 | Temp 98.9°F | Wt 157.0 lb

## 2021-02-16 DIAGNOSIS — B9689 Other specified bacterial agents as the cause of diseases classified elsewhere: Secondary | ICD-10-CM

## 2021-02-16 DIAGNOSIS — N760 Acute vaginitis: Secondary | ICD-10-CM

## 2021-02-16 MED ORDER — METRONIDAZOLE 500 MG PO TABS: 500.0000 mg | ORAL_TABLET | Freq: Two times a day (BID) | ORAL | 0 refills | Status: AC

## 2021-02-16 NOTE — Assessment & Plan Note (Signed)
Ongoing symptoms post Metrogel and oral Clindamycin treatment, UA clear with exception of LEUKS and bacteria -- will send for culture to further assess.  Wet prep noting clue cells, negative yeast and trich.  Would prefer not to use oral Flagyl as taste makes her nauseous, but at this time will trial since have attempted other options -- Flagyl 500 MG BID x 7 days sent in.  Recommend to mask taste take with chocolate milk or mints. Return in one week.  Educated on BV.

## 2021-02-16 NOTE — Patient Instructions (Signed)
Bacterial Vaginosis  Bacterial vaginosis is an infection of the vagina. It happens when too many normal germs (healthy bacteria) grow in the vagina. This infection can make it easier to get other infections from sex (STIs). It is very important for pregnant women to get treated. This infection can cause babies to be born early or at a low birth weight. What are the causes? This infection is caused by an increase in certain germs that grow in the vagina. You cannot get this infection from toilet seats, bedsheets, swimming pools, or things that touch your vagina. What increases the risk?  Having sex with a new person or more than one person.  Having sex without protection.  Douching.  Having an intrauterine device (IUD).  Smoking.  Using drugs or drinking alcohol. These can lead you to do things that are risky.  Taking certain antibiotic medicines.  Being pregnant. What are the signs or symptoms? Some women have no symptoms. Symptoms may include:  A discharge from your vagina. It may be gray or white. It can be watery or foamy.  A fishy smell. This can happen after sex or during your menstrual period.  Itching in and around your vagina.  A feeling of burning or pain when you pee (urinate). How is this treated? This infection is treated with antibiotic medicines. These may be given to you as:  A pill.  A cream for your vagina.  A medicine that you put into your vagina (suppository). If the infection comes back after treatment, you may need more antibiotics. Follow these instructions at home: Medicines  Take over-the-counter and prescription medicines as told by your doctor.  Take or use your antibiotic medicine as told by your doctor. Do not stop taking or using it, even if you start to feel better. General instructions  If the person you have sex with is a woman, tell her that you have this infection. She will need to follow up with her doctor. If you have a female  partner, he does not need to be treated.  Do not have sex until you finish treatment.  Drink enough fluid to keep your pee pale yellow.  Keep your vagina and butt clean. ? Wash the area with warm water each day. ? Wipe from front to back after you use the toilet.  If you are breastfeeding a baby, ask your doctor if you should keep doing so during treatment.  Keep all follow-up visits. How is this prevented? Self-care  Do not douche.  Use only warm water to wash around your vagina.  Wear underwear that is cotton or lined with cotton.  Do not wear tight pants and pantyhose, especially in the summer. Safe sex  Use protection when you have sex. This includes: ? Use condoms. ? Use dental dams. This is a thin layer that protects the mouth during oral sex.  Limit how many people you have sex with. To prevent this infection, it is best to have sex with just one person.  Get tested for STIs. The person you have sex with should also get tested. Drugs and alcohol  Do not smoke or use any products that contain nicotine or tobacco. If you need help quitting, ask your doctor.  Do not use drugs.  Do not drink alcohol if: ? Your doctor tells you not to drink. ? You are pregnant, may be pregnant, or are planning to become pregnant.  If you drink alcohol: ? Limit how much you have to 0-1 drink   a day. ? Know how much alcohol is in your drink. In the [place] one drink equals one 12 oz bottle of beer (355 mL), one 5 oz glass of wine (148 mL), or one 1 oz glass of hard liquor (44 mL). Where to find more information  Centers for Disease Control and Prevention: www.cdc.gov  American Sexual Health Association: www.ashastd.org  Office on Women's Health: www.womenshealth.gov Contact a doctor if:  Your symptoms do not get better, even after you are treated.  You have more discharge or pain when you pee.  You have a fever or chills.  You have pain in your belly (abdomen) or in the area  between your hips.  You have pain with sex.  You bleed from your vagina between menstrual periods. Summary  This infection can happen when too many germs (bacteria) grow in the vagina.  This infection can make it easier to get infections from sex (STIs). Treating this can lower that chance.  Get treated if you are pregnant. This infection can cause babies to be born early.  Do not stop taking or using your antibiotic medicine, even if you start to feel better. This information is not intended to replace advice given to you by your health care provider. Make sure you discuss any questions you have with your health care provider. Document Revised: 05/01/2020 Document Reviewed: 05/01/2020 Elsevier Patient Education  2021 Elsevier Inc.  

## 2021-02-16 NOTE — Progress Notes (Signed)
BP 109/75   Pulse (!) 59   Temp 98.9 F (37.2 C) (Oral)   Wt 157 lb (71.2 kg)   LMP  (LMP Unknown)   SpO2 98%   BMI 25.72 kg/m    Subjective:    Patient ID: Kristen May, female    DOB: 1951-01-09, 70 y.o.   MRN: [id number]  HPI: Kristen May is a 70 y.o. female  Chief Complaint  Patient presents with  . Vaginitis    Patient states she notices a little bit of improvement, but is still having some issues of the vaginal burning when she has to urinate.   VAGINAL DISCHARGE Treated on 01/20/21 for bacterial vaginosis presenting post abx treatment for pneumonia.  Was treated with Metrogel and then switched over to Clindamycin 300 MG x 7 days due to ongoing infection.  Reports some improvement, finished 7 days dosing last Thursday, but reports some ongoing vaginal burning when she urinates.   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 Interim medical history since our last visit reviewed. Allergies and medications reviewed and updated.  Review of Systems  Constitutional: Negative for activity change, appetite change, diaphoresis, fatigue and fever.  Respiratory: Negative for cough, chest tightness and shortness of breath.   Cardiovascular: Negative for chest pain, palpitations and leg swelling.  Genitourinary: Negative for decreased urine volume, dysuria, frequency, hematuria, urgency, vaginal bleeding, vaginal discharge and vaginal pain.  Psychiatric/Behavioral: Negative.     Per HPI unless specifically indicated above     Objective:    BP 109/75   Pulse (!) 59   Temp 98.9 F  (37.2 C) (Oral)   Wt 157 lb (71.2 kg)   LMP  (LMP Unknown)   SpO2 98%   BMI 25.72 kg/m   Wt Readings from Last 3 Encounters:  02/16/21 157 lb (71.2 kg)  02/04/21 157 lb 12.8 oz (71.6 kg)  01/20/21 155 lb (70.3 kg)    Physical Exam Vitals and nursing note reviewed.  Constitutional:      General: She is awake. She is not in acute distress.    Appearance: She is well-developed and well-groomed. She is not ill-appearing or toxic-appearing.  HENT:     Head: Normocephalic.     Right Ear: Hearing normal.     Left Ear: Hearing normal.     Nose: Nose normal.  Eyes:     General: Lids are normal.        Right eye: No discharge.        Left eye: No discharge.     Conjunctiva/sclera: Conjunctivae normal.     Pupils: Pupils are equal, round, and reactive to light.  Neck:     Thyroid: No thyromegaly.     Vascular: No carotid bruit.  Cardiovascular:     Rate and Rhythm: Normal rate and regular rhythm.     Heart sounds: Normal heart sounds. No murmur heard. No gallop.   Pulmonary:     Effort: Pulmonary effort is normal. No accessory muscle usage or respiratory distress.     Breath sounds: Normal breath sounds.  Abdominal:     General: Bowel sounds are normal.     Palpations: Abdomen is soft.  Musculoskeletal:     Cervical back: Normal range of motion and neck supple.     Right lower leg: No edema.     Left lower leg: No edema.  Lymphadenopathy:     Cervical: No cervical adenopathy.  Skin:    General: Skin is warm and dry.  Neurological:     Mental Status: She is alert and oriented to person, place, and time.  Psychiatric:        Attention and Perception: Attention normal.        Mood and Affect: Mood normal.        Speech: Speech normal.        Behavior: Behavior normal. Behavior is cooperative.        Thought Content: Thought content normal.    Results for orders placed or performed in visit on 02/04/21  WET PREP FOR TRICH, YEAST, CLUE   Specimen: Sterile Swab   Sterile  Swab  Result Value Ref Range   Trichomonas Exam Negative Negative   Yeast Exam Negative Negative   Clue Cell Exam Positive (A) Negative  Microscopic Examination   Urine  Result Value Ref Range   WBC, UA 0-5 0 - 5 /hpf   RBC None seen 0 - 2 /hpf   Epithelial Cells (non renal) 0-10 0 - 10 /hpf   Bacteria, UA Few (A) None seen/Few  Urinalysis, Routine w reflex microscopic  Result Value Ref Range   Specific Gravity, UA 1.010 1.005 - 1.030   pH, UA 7.0 5.0 - 7.5   Color, UA Yellow Yellow   Appearance Ur Clear Clear   Leukocytes,UA 1+ (A) Negative   Protein,UA Negative Negative/Trace   Glucose, UA Negative Negative   Ketones, UA Negative Negative   RBC, UA Negative Negative   Bilirubin, UA Negative Negative   Urobilinogen, Ur 0.2 0.2 - 1.0 mg/dL   Nitrite, UA Negative Negative   Microscopic Examination See below:       Assessment & Plan:   Problem List Items Addressed This Visit      Genitourinary   Bacterial vaginosis - Primary    Ongoing symptoms post Metrogel and oral Clindamycin treatment, UA clear with exception of LEUKS and bacteria -- will send for culture to further assess.  Wet prep noting clue cells, negative yeast and trich.  Would prefer not to use oral Flagyl as taste makes her nauseous, but at this time will trial since have attempted other options -- Flagyl 500 MG BID x 7 days sent in.  Recommend to mask taste take with chocolate milk or mints. Return in one week.  Educated on BV.      Relevant Medications   metroNIDAZOLE (FLAGYL) 500 MG tablet   Other Relevant Orders   Urinalysis, Routine w reflex microscopic   WET PREP FOR TRICH, YEAST, CLUE   Urine Culture       Follow up plan: Return in about 1 week (around 02/23/2021) for Bacteria vaginosis follow-up.

## 2021-02-17 LAB — URINALYSIS, ROUTINE W REFLEX MICROSCOPIC
Bilirubin, UA: NEGATIVE
Glucose, UA: NEGATIVE
Ketones, UA: NEGATIVE
Nitrite, UA: NEGATIVE
Protein,UA: NEGATIVE
Specific Gravity, UA: 1.01 (ref 1.005–1.030)
Urobilinogen, Ur: 0.2 mg/dL (ref 0.2–1.0)
pH, UA: 5.5 (ref 5.0–7.5)

## 2021-02-17 LAB — MICROSCOPIC EXAMINATION: RBC, Urine: NONE SEEN /hpf (ref 0–2)

## 2021-02-17 LAB — WET PREP FOR TRICH, YEAST, CLUE
Clue Cell Exam: POSITIVE — AB
Trichomonas Exam: NEGATIVE
Yeast Exam: NEGATIVE

## 2021-02-19 ENCOUNTER — Other Ambulatory Visit: Payer: Self-pay | Admitting: Nurse Practitioner

## 2021-02-19 MED ORDER — CEPHALEXIN 250 MG PO CAPS: 250.0000 mg | ORAL_CAPSULE | Freq: Two times a day (BID) | ORAL | 0 refills | Status: DC

## 2021-02-19 NOTE — Progress Notes (Signed)
Keflex for UTI treatment

## 2021-02-19 NOTE — Progress Notes (Signed)
Contacted via MyChart   Good afternoon Sabriya, so I have bad news.  Looks like the urine infection is back.  You are showing >100,000 growth in urine.  I am going to send in Keflex for you to take for 7 days.  If after all of this you do not feel better let me know.  It would be good to retest urine after treatment completed.  Any questions? Keep being awesome!!  Thank you for allowing me to participate in your care. Kindest regards, Goro Wenrick

## 2021-02-20 LAB — URINE CULTURE

## 2021-02-23 ENCOUNTER — Ambulatory Visit: Payer: PRIVATE HEALTH INSURANCE | Admitting: Nurse Practitioner

## 2021-02-26 ENCOUNTER — Other Ambulatory Visit: Payer: Self-pay | Admitting: Nurse Practitioner

## 2021-02-26 MED ORDER — CEPHALEXIN 250 MG PO CAPS: 250.0000 mg | ORAL_CAPSULE | Freq: Two times a day (BID) | ORAL | 0 refills | Status: AC

## 2021-03-02 ENCOUNTER — Ambulatory Visit: Payer: PRIVATE HEALTH INSURANCE | Admitting: Nurse Practitioner

## 2021-03-03 ENCOUNTER — Telehealth: Payer: Self-pay

## 2021-03-03 NOTE — Telephone Encounter (Signed)
Called pt to see about re-scheduling with someone in office. Pt is wanting to see if provider can put in order without her having an appt. Please advise.  Copied from CRM [id number]. Topic: Appointment Scheduling - Scheduling Inquiry for Clinic >> Mar 03, 2021 10:42 AM [name] D wrote: Reason for CRM: Pt called wanting to resch the appt she had with Jolene but there was nothing until the 26th.  Sh wanted to be seen sooner than that because [place] is going [place] of the [place].  CB#  [phone]

## 2021-03-06 ENCOUNTER — Other Ambulatory Visit: Payer: Self-pay

## 2021-03-06 ENCOUNTER — Encounter: Payer: Self-pay | Admitting: Nurse Practitioner

## 2021-03-06 ENCOUNTER — Ambulatory Visit (INDEPENDENT_AMBULATORY_CARE_PROVIDER_SITE_OTHER): Payer: Medicare Other | Admitting: Nurse Practitioner

## 2021-03-06 VITALS — BP 144/81 | HR 60 | Temp 97.9°F

## 2021-03-06 DIAGNOSIS — B9689 Other specified bacterial agents as the cause of diseases classified elsewhere: Secondary | ICD-10-CM | POA: Diagnosis not present

## 2021-03-06 DIAGNOSIS — N760 Acute vaginitis: Secondary | ICD-10-CM

## 2021-03-06 DIAGNOSIS — R399 Unspecified symptoms and signs involving the genitourinary system: Secondary | ICD-10-CM | POA: Diagnosis not present

## 2021-03-06 LAB — URINALYSIS, ROUTINE W REFLEX MICROSCOPIC
Bilirubin, UA: NEGATIVE
Glucose, UA: NEGATIVE
Ketones, UA: NEGATIVE
Nitrite, UA: NEGATIVE
Protein,UA: NEGATIVE
RBC, UA: NEGATIVE
Specific Gravity, UA: 1.02 (ref 1.005–1.030)
Urobilinogen, Ur: 0.2 mg/dL (ref 0.2–1.0)
pH, UA: 5.5 (ref 5.0–7.5)

## 2021-03-06 LAB — MICROSCOPIC EXAMINATION
Bacteria, UA: NONE SEEN
RBC, Urine: NONE SEEN /hpf (ref 0–2)

## 2021-03-06 LAB — WET PREP FOR TRICH, YEAST, CLUE
Clue Cell Exam: POSITIVE — AB
Trichomonas Exam: NEGATIVE
Yeast Exam: NEGATIVE

## 2021-03-06 MED ORDER — METRONIDAZOLE 0.75 % VA GEL: VAGINAL | 3 refills | Status: DC

## 2021-03-06 NOTE — Assessment & Plan Note (Addendum)
Ongoing symptoms post multiple oral and gel treatments, UA clear with exception of LEUKS -- will send for culture to further assess and ensure clearance of recent UTI.  Wet prep noting clue cells, negative yeast and trich.  At this time due to ongoing recurrence of BV since hospitalization with PNA and GI treatments, will treat with Metrogel nightly x 10 days and then switch to twice a week nightly dosing for 3 months. Return at that time to assess wet prep and symptoms.  If ongoing issues will consider referral to GYN.  Recommend she continue daily probiotic tablet.

## 2021-03-06 NOTE — Patient Instructions (Signed)
Bacterial Vaginosis  Bacterial vaginosis is an infection of the vagina. It happens when too many normal germs (healthy bacteria) grow in the vagina. This infection can make it easier to get other infections from sex (STIs). It is very important for pregnant women to get treated. This infection can cause babies to be born early or at a low birth weight. What are the causes? This infection is caused by an increase in certain germs that grow in the vagina. You cannot get this infection from toilet seats, bedsheets, swimming pools, or things that touch your vagina. What increases the risk?  Having sex with a new person or more than one person.  Having sex without protection.  Douching.  Having an intrauterine device (IUD).  Smoking.  Using drugs or drinking alcohol. These can lead you to do things that are risky.  Taking certain antibiotic medicines.  Being pregnant. What are the signs or symptoms? Some women have no symptoms. Symptoms may include:  A discharge from your vagina. It may be gray or white. It can be watery or foamy.  A fishy smell. This can happen after sex or during your menstrual period.  Itching in and around your vagina.  A feeling of burning or pain when you pee (urinate). How is this treated? This infection is treated with antibiotic medicines. These may be given to you as:  A pill.  A cream for your vagina.  A medicine that you put into your vagina (suppository). If the infection comes back after treatment, you may need more antibiotics. Follow these instructions at home: Medicines  Take over-the-counter and prescription medicines as told by your doctor.  Take or use your antibiotic medicine as told by your doctor. Do not stop taking or using it, even if you start to feel better. General instructions  If the person you have sex with is a woman, tell her that you have this infection. She will need to follow up with her doctor. If you have a female  partner, he does not need to be treated.  Do not have sex until you finish treatment.  Drink enough fluid to keep your pee pale yellow.  Keep your vagina and butt clean. ? Wash the area with warm water each day. ? Wipe from front to back after you use the toilet.  If you are breastfeeding a baby, ask your doctor if you should keep doing so during treatment.  Keep all follow-up visits. How is this prevented? Self-care  Do not douche.  Use only warm water to wash around your vagina.  Wear underwear that is cotton or lined with cotton.  Do not wear tight pants and pantyhose, especially in the summer. Safe sex  Use protection when you have sex. This includes: ? Use condoms. ? Use dental dams. This is a thin layer that protects the mouth during oral sex.  Limit how many people you have sex with. To prevent this infection, it is best to have sex with just one person.  Get tested for STIs. The person you have sex with should also get tested. Drugs and alcohol  Do not smoke or use any products that contain nicotine or tobacco. If you need help quitting, ask your doctor.  Do not use drugs.  Do not drink alcohol if: ? Your doctor tells you not to drink. ? You are pregnant, may be pregnant, or are planning to become pregnant.  If you drink alcohol: ? Limit how much you have to 0-1 drink   a day. ? Know how much alcohol is in your drink. In the [place] one drink equals one 12 oz bottle of beer (355 mL), one 5 oz glass of wine (148 mL), or one 1 oz glass of hard liquor (44 mL). Where to find more information  Centers for Disease Control and Prevention: www.cdc.gov  American Sexual Health Association: www.ashastd.org  Office on Women's Health: www.womenshealth.gov Contact a doctor if:  Your symptoms do not get better, even after you are treated.  You have more discharge or pain when you pee.  You have a fever or chills.  You have pain in your belly (abdomen) or in the area  between your hips.  You have pain with sex.  You bleed from your vagina between menstrual periods. Summary  This infection can happen when too many germs (bacteria) grow in the vagina.  This infection can make it easier to get infections from sex (STIs). Treating this can lower that chance.  Get treated if you are pregnant. This infection can cause babies to be born early.  Do not stop taking or using your antibiotic medicine, even if you start to feel better. This information is not intended to replace advice given to you by your health care provider. Make sure you discuss any questions you have with your health care provider. Document Revised: 05/01/2020 Document Reviewed: 05/01/2020 Elsevier Patient Education  2021 Elsevier Inc.  

## 2021-03-06 NOTE — Progress Notes (Signed)
BP (!) 144/81   Pulse 60   Temp 97.9 F (36.6 C) (Oral)   LMP  (LMP Unknown)    Subjective:    Patient ID: Kristen May, female    DOB: 1951/04/24, 70 y.o.   MRN: [id number]  HPI: Kristen May is a 70 y.o. female  Chief Complaint  Patient presents with  . Follow-up    Patient states she is following up for ongoing issue. Patient states she has completed Keflex treatment and states she has days where she feels she is getting better and then some days she feels as if there are still symptoms. Patient states she is leaving for [facility] Thursday and would like to get it taken care of.     . This visit was completed via telephone due to the restrictions of the COVID-19 pandemic. All issues as above were discussed and addressed but no physical exam was performed. If it was felt that the patient should be evaluated in the office, they were directed there. The patient verbally consented to this visit. Patient was unable to complete an audio/visual visit due to Lack of equipment. Due to the catastrophic nature of the COVID-19 pandemic, this visit was done through audio contact only. . Location of the patient: home . Location of the provider: home . Those involved with this call:  . Provider: Aura Dials, DNP . CMA: Wilhemena Durie, CMA . Front Desk/Registration: Harriet Pho  . Time spent on call: 21 minutes on the phone discussing health concerns. 15 minutes total spent in review of patient's record and preparation of their chart.  . I verified patient identity using two factors (patient name and date of birth). Patient consents verbally to being seen via telemedicine visit today.    VAGINAL DISCHARGE Treated on 01/20/21 for bacterial vaginosis presenting post abx treatment for pneumonia.  Was treated with Metrogel and then switched over to Clindamycin 300 MG x 7 days due to ongoing infection.  Reports some improvement, with this but not 100% and on 02/16/21 continued to show clue cells on exam and  UTI present that was treated with Keflex.  For BV was recently treated with oral Flagyl.  Reports ongoing mild symptoms today, will improve a bit and then return -- symptoms are not as frequent but are still present at end of treatment. 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 Interim medical history since our last visit reviewed. Allergies and medications reviewed and updated.  Review of Systems  Constitutional: Negative for activity change, appetite change, diaphoresis, fatigue and fever.  Respiratory: Negative for cough, chest tightness and shortness of breath.   Cardiovascular: Negative for chest pain, palpitations and leg swelling.  Genitourinary: Positive for dysuria and vaginal discharge. Negative for decreased urine volume, frequency, hematuria, urgency, vaginal bleeding and vaginal pain.  Psychiatric/Behavioral: Negative.     Per HPI unless specifically indicated above     Objective:    BP (!) 144/81   Pulse 60   Temp 97.9 F (36.6 C) (Oral)   LMP  (LMP Unknown)   Wt Readings from Last 3 Encounters:  02/16/21 157 lb (71.2 kg)  02/04/21 157 lb 12.8 oz (71.6 kg)  01/20/21 155 lb (70.3 kg)    Physical Exam   Unable to perform due to telephone visit only.  Results for orders placed or performed in visit on 02/16/21  WET PREP FOR TRICH, YEAST, CLUE   Specimen: Sterile Swab   Sterile Swab  Result Value Ref Range   Trichomonas Exam Negative Negative   Yeast Exam Negative Negative   Clue Cell Exam Positive (A) Negative  Urine Culture    Specimen: Urine   UR  Result Value Ref Range   Urine Culture, Routine Final report (A)    Organism ID, Bacteria Enterococcus faecalis (A)    ORGANISM ID, BACTERIA Escherichia coli (A)    Antimicrobial Susceptibility Comment   Microscopic Examination   Urine  Result Value Ref Range   WBC, UA 6-10 (A) 0 - 5 /hpf   RBC None seen 0 - 2 /hpf   Epithelial Cells (non renal) 0-10 0 - 10 /hpf   Bacteria, UA Moderate (A) None seen/Few  Urinalysis, Routine w reflex microscopic  Result Value Ref Range   Specific Gravity, UA 1.010 1.005 - 1.030   pH, UA 5.5 5.0 - 7.5   Color, UA Yellow Yellow   Appearance Ur Cloudy (A) Clear   Leukocytes,UA 1+ (A) Negative   Protein,UA Negative Negative/Trace   Glucose, UA Negative Negative   Ketones, UA Negative Negative   RBC, UA Trace (A) Negative   Bilirubin, UA Negative Negative   Urobilinogen, Ur 0.2 0.2 - 1.0 mg/dL   Nitrite, UA Negative Negative   Microscopic Examination See below:       Assessment & Plan:   Problem List Items Addressed This Visit      Genitourinary   Bacterial vaginosis - Primary    Ongoing symptoms post multiple oral and gel treatments, UA clear with exception of LEUKS -- will send for culture to further assess and ensure clearance of recent UTI.  Wet prep noting clue cells, negative yeast and trich.  At this time due to ongoing recurrence of BV since hospitalization with PNA and GI treatments, will treat with Metrogel nightly x 10 days and then switch to twice a week nightly dosing for 3 months. Return at that time to assess wet prep and symptoms.  If ongoing issues will consider referral to GYN.  Recommend she continue daily probiotic tablet.       Other Visit Diagnoses    Urinary symptom or sign       With recent UTI treated with Keflex.  UA clear with exception of LEUKS -- will send for culture to further assess and ensure clearance of recent UTI.    Relevant Orders   WET PREP FOR TRICH, YEAST, CLUE   Urinalysis,  Routine w reflex microscopic   Urine Culture      I discussed the assessment and treatment plan with the patient. The patient was provided an opportunity to ask questions and all were answered. The patient agreed with the plan and demonstrated an understanding of the instructions.   The patient was advised to call back or seek an in-person evaluation if the symptoms worsen or if the condition fails to improve as anticipated.   I provided 21+ minutes of time during this encounter.  Follow up plan: Return in about 3 months (around 06/05/2021) for BV and urine check.

## 2021-03-08 LAB — URINE CULTURE

## 2021-03-08 NOTE — Progress Notes (Signed)
Contacted via MyChart   Good evening Kristen May, your urine culture showed no significant growth, improved urine infection.:)

## 2021-03-09 NOTE — Progress Notes (Signed)
Unable to lvm to make this apt/  

## 2021-03-11 ENCOUNTER — Telehealth: Payer: Self-pay

## 2021-03-11 NOTE — Telephone Encounter (Signed)
-----   Message from Marjie Skiff, NP sent at 03/06/2021  4:30 PM EDT ----- 3 month follow-up needed

## 2021-03-11 NOTE — Telephone Encounter (Signed)
3 months f/u

## 2021-03-12 NOTE — Telephone Encounter (Signed)
Lvm to make this apt. 

## 2021-04-16 ENCOUNTER — Other Ambulatory Visit: Payer: Self-pay

## 2021-04-16 DIAGNOSIS — Z1231 Encounter for screening mammogram for malignant neoplasm of breast: Secondary | ICD-10-CM

## 2021-06-12 ENCOUNTER — Encounter: Payer: Self-pay | Admitting: Nurse Practitioner

## 2021-07-29 ENCOUNTER — Ambulatory Visit (INDEPENDENT_AMBULATORY_CARE_PROVIDER_SITE_OTHER): Payer: Medicare Other

## 2021-07-29 DIAGNOSIS — Z0000 Encounter for general adult medical examination without abnormal findings: Secondary | ICD-10-CM

## 2021-07-29 NOTE — Patient Instructions (Signed)
Health Maintenance, Female Adopting a healthy lifestyle and getting preventive care are important in promoting health and wellness. Ask your health care provider about: The right schedule for you to have regular tests and exams. Things you can do on your own to prevent diseases and keep yourself healthy. What should I know about diet, weight, and exercise? Eat a healthy diet  Eat a diet that includes plenty of vegetables, fruits, low-fat dairy products, and lean protein. Do not eat a lot of foods that are high in solid fats, added sugars, or sodium. Maintain a healthy weight Body mass index (BMI) is used to identify weight problems. It estimates body fat based on height and weight. Your health care provider can help determine your BMI and help you achieve or maintain a healthy weight. Get regular exercise Get regular exercise. This is one of the most important things you can do for your health. Most adults should: Exercise for at least 150 minutes each week. The exercise should increase your heart rate and make you sweat (moderate-intensity exercise). Do strengthening exercises at least twice a week. This is in addition to the moderate-intensity exercise. Spend less time sitting. Even light physical activity can be beneficial. Watch cholesterol and blood lipids Have your blood tested for lipids and cholesterol at 69 years of age, then have this test every 5 years. Have your cholesterol levels checked more often if: Your lipid or cholesterol levels are high. You are older than 69 years of age. You are at high risk for heart disease. What should I know about cancer screening? Depending on your health history and family history, you may need to have cancer screening at various ages. This may include screening for: Breast cancer. Cervical cancer. Colorectal cancer. Skin cancer. Lung cancer. What should I know about heart disease, diabetes, and high blood pressure? Blood pressure and heart  disease High blood pressure causes heart disease and increases the risk of stroke. This is more likely to develop in people who have high blood pressure readings, are of African descent, or are overweight. Have your blood pressure checked: Every 3-5 years if you are 18-39 years of age. Every year if you are 40 years old or older. Diabetes Have regular diabetes screenings. This checks your fasting blood sugar level. Have the screening done: Once every three years after age 40 if you are at a normal weight and have a low risk for diabetes. More often and at a younger age if you are overweight or have a high risk for diabetes. What should I know about preventing infection? Hepatitis B If you have a higher risk for hepatitis B, you should be screened for this virus. Talk with your health care provider to find out if you are at risk for hepatitis B infection. Hepatitis C Testing is recommended for: Everyone born from 1945 through 1965. Anyone with known risk factors for hepatitis C. Sexually transmitted infections (STIs) Get screened for STIs, including gonorrhea and chlamydia, if: You are sexually active and are younger than 69 years of age. You are older than 69 years of age and your health care provider tells you that you are at risk for this type of infection. Your sexual activity has changed since you were last screened, and you are at increased risk for chlamydia or gonorrhea. Ask your health care provider if you are at risk. Ask your health care provider about whether you are at high risk for HIV. Your health care provider may recommend a prescription medicine   to help prevent HIV infection. If you choose to take medicine to prevent HIV, you should first get tested for HIV. You should then be tested every 3 months for as long as you are taking the medicine. Pregnancy If you are about to stop having your period (premenopausal) and you may become pregnant, seek counseling before you get  pregnant. Take 400 to 800 micrograms (mcg) of folic acid every day if you become pregnant. Ask for birth control (contraception) if you want to prevent pregnancy. Osteoporosis and menopause Osteoporosis is a disease in which the bones lose minerals and strength with aging. This can result in bone fractures. If you are 65 years old or older, or if you are at risk for osteoporosis and fractures, ask your health care provider if you should: Be screened for bone loss. Take a calcium or vitamin D supplement to lower your risk of fractures. Be given hormone replacement therapy (HRT) to treat symptoms of menopause. Follow these instructions at home: Lifestyle Do not use any products that contain nicotine or tobacco, such as cigarettes, e-cigarettes, and chewing tobacco. If you need help quitting, ask your health care provider. Do not use street drugs. Do not share needles. Ask your health care provider for help if you need support or information about quitting drugs. Alcohol use Do not drink alcohol if: Your health care provider tells you not to drink. You are pregnant, may be pregnant, or are planning to become pregnant. If you drink alcohol: Limit how much you use to 0-1 drink a day. Limit intake if you are breastfeeding. Be aware of how much alcohol is in your drink. In the [place] one drink equals one 12 oz bottle of beer (355 mL), one 5 oz glass of wine (148 mL), or one 1 oz glass of hard liquor (44 mL). General instructions Schedule regular health, dental, and eye exams. Stay current with your vaccines. Tell your health care provider if: You often feel depressed. You have ever been abused or do not feel safe at home. Summary Adopting a healthy lifestyle and getting preventive care are important in promoting health and wellness. Follow your health care provider's instructions about healthy diet, exercising, and getting tested or screened for diseases. Follow your health care provider's  instructions on monitoring your cholesterol and blood pressure. This information is not intended to replace advice given to you by your health care provider. Make sure you discuss any questions you have with your health care provider. Document Revised: 01/09/2021 Document Reviewed: 10/25/2018 [employer]vier Patient Education  2022 [employer]  

## 2021-07-29 NOTE — Progress Notes (Signed)
Subjective:   Kristen May is a 70 y.o. female who presents for Medicare Annual (Subsequent) preventive examination.  I connected with  Kristen May on 07/29/21 by an audio only telemedicine application and verified that I am speaking with the correct person using two identifiers.   I discussed the limitations, risks, security and privacy concerns of performing an evaluation and management service by telephone and the availability of in person appointments. I also discussed with the patient that there may be a patient responsible charge related to this service. The patient expressed understanding and verbally consented to this telephonic visit.  Location of Patient: home Location of Provider: office  List any persons and their role that are participating in the visit with the patient: [place] (patient), [place] (CMA)  Review of Systems    Defer to PCP. Cardiac Risk Factors include: none     Objective:    There were no vitals filed for this visit. There is no height or weight on file to calculate BMI.  Advanced Directives 07/29/2021  Does Patient Have a Medical Advance Directive? No  Would patient like information on creating a medical advance directive? No - Patient declined    Current Medications (verified) Outpatient Encounter Medications as of 07/29/2021  Medication Sig   LORazepam (ATIVAN) 1 MG tablet Take 1 tablet (1 mg total) by mouth at bedtime as needed for anxiety.   metroNIDAZOLE (METROGEL VAGINAL) 0.75 % vaginal gel Place one applicator full daily vaginally at night for 10 days, then switch to placing one applicator full vaginally at night twice a week for the next 3 months.   nystatin cream (MYCOSTATIN)    pantoprazole (PROTONIX) 40 MG tablet Take 40 mg by mouth in the morning and at bedtime. (Patient not taking: No sig reported)   zolpidem (AMBIEN) 10 MG tablet Take 10 mg by mouth at bedtime as needed.  (Patient not taking: No sig reported)   No facility-administered  encounter medications on file as of 07/29/2021.    Allergies (verified) Morphine and related   History: Past Medical History:  Diagnosis Date   Anxiety    Back pain    Cervicalgia    Depression    Elevated vitamin B12 level    Insomnia    Lumbago    Osteopenia    Past Surgical History:  Procedure Laterality Date   CHOLECYSTECTOMY     CHOLESTEATOMA EXCISION     eye surgerry     SKIN GRAFT     Family History  Problem Relation Age of Onset   Cancer Mother        stomach   COPD Mother    Heart disease Father    Cancer Father        colon   Diabetes Father    Polycystic ovary syndrome Daughter    Cancer Paternal Grandfather        rectal   Depression Daughter    Bipolar disorder Daughter    Social History   Socioeconomic History   Marital status: Widowed    Spouse name: Not on file   Number of children: Not on file   Years of education: Not on file   Highest education level: Not on file  Occupational History   Not on file  Tobacco Use   Smoking status: Former   Smokeless tobacco: Never  Vaping Use   Vaping Use: Never used  Substance and Sexual Activity   Alcohol use: Not Currently    Alcohol/week: 0.0 standard drinks  Comment: on occasion   Drug use: No   Sexual activity: Yes  Other Topics Concern   Not on file  Social History Narrative   Not on file   Social Determinants of Health   Financial Resource Strain: Low Risk    Difficulty of Paying Living Expenses: Not hard at all  Food Insecurity: No Food Insecurity   Worried About Programme researcher, broadcasting/film/video in the Last Year: Never true   Ran Out of Food in the Last Year: Never true  Transportation Needs: No Transportation Needs   Lack of Transportation (Medical): No   Lack of Transportation (Non-Medical): No  Physical Activity: Insufficiently Active   Days of Exercise per Week: 4 days   Minutes of Exercise per Session: 30 min  Stress: No Stress Concern Present   Feeling of Stress : Not at all  Social  Connections: Moderately Integrated   Frequency of Communication with Friends and Family: Once a week   Frequency of Social Gatherings with Friends and Family: Three times a week   Attends Religious Services: Never   Active Member of Clubs or Organizations: Yes   Attends Banker Meetings: Never   Marital Status: Living with partner    Tobacco Counseling Counseling given: Not Answered   Clinical Intake:  Pre-visit preparation completed: Yes  Pain : No/denies pain     Nutritional Risks: None Diabetes: No  How often do you need to have someone help you when you read instructions, pamphlets, or other written materials from your doctor or pharmacy?: 1 - Never What is the last grade level you completed in school?: Bachelor's Degree  Diabetic?No  Interpreter Needed?: No      Activities of Daily Living In your present state of health, do you have any difficulty performing the following activities: 07/29/2021  Hearing? Y  Vision? N  Difficulty concentrating or making decisions? N  Walking or climbing stairs? N  Dressing or bathing? N  Doing errands, shopping? N  Preparing Food and eating ? N  Using the Toilet? N  In the past six months, have you accidently leaked urine? N  Do you have problems with loss of bowel control? N  Managing your Medications? N  Managing your Finances? N  Housekeeping or managing your Housekeeping? N  Some recent data might be hidden    Patient Care Team: Marjie Skiff, NP as PCP - General (Nurse Practitioner)  Indicate any recent Medical Services you may have received from other than [facility] providers in the past year (date may be approximate).     Assessment:   This is a routine wellness examination for [facility].  Hearing/Vision screen No results found.  Dietary issues and exercise activities discussed: Current Exercise Habits: Home exercise routine, Type of exercise: walking, Time (Minutes): 30, Frequency (Times/Week): 4,  Weekly Exercise (Minutes/Week): 120, Intensity: Mild, Exercise limited by: None identified   Goals Addressed   None   Depression Screen PHQ 2/9 Scores 07/29/2021 12/23/2020 03/07/2020 08/24/2017 08/23/2016  PHQ - 2 Score 0 0 0 0 0  PHQ- 9 Score - - - 0 0    Fall Risk Fall Risk  07/29/2021 12/23/2020 03/07/2020 08/24/2017 08/23/2016  Falls in the past year? 0 0 0 No No  Number falls in past yr: 0 - 0 - -  Injury with Fall? 0 - 0 - -  Risk for fall due to : No Fall Risks - - - -  Follow up Falls evaluation completed - Falls evaluation completed - -  FALL RISK PREVENTION PERTAINING TO THE HOME:  Any stairs in or around the home? Yes  If so, are there any without handrails? Yes  Home free of loose throw rugs in walkways, pet beds, electrical cords, etc? No  Adequate lighting in your home to reduce risk of falls? Yes   ASSISTIVE DEVICES UTILIZED TO PREVENT FALLS:  Life alert? No  Use of a cane, walker or w/c? No  Grab bars in the bathroom? No  Shower chair or bench in shower? No  Elevated toilet seat or a handicapped toilet? No   TIMED UP AND GO:  Was the test performed?  N/A .  Length of time to ambulate 10 feet: N/A sec.     Cognitive Function:     6CIT Screen 07/29/2021  What Year? 0 points  What month? 0 points  What time? 0 points  Count back from 20 0 points  Months in reverse 0 points  Repeat phrase 0 points  Total Score 0    Immunizations Immunization History  Administered Date(s) Administered   Fluad Quad(high Dose 65+) 08/24/2017, 10/25/2018, 08/02/2019, 08/12/2020   Influenza, High Dose Seasonal PF 08/24/2017, 10/25/2018   Influenza,inj,Quad PF,6+ Mos 11/27/2015, 08/23/2016   PFIZER(Purple Top)SARS-COV-2 Vaccination 12/10/2019, 01/01/2020, 08/12/2020, 02/18/2021   Pneumococcal Conjugate-13 08/24/2017   Pneumococcal Polysaccharide-23 03/07/2020   Pneumococcal-Unspecified 07/29/2014   Td 02/21/2007   Tdap 08/24/2017    TDAP status: Up to date  Flu  Vaccine status: Due, Education has been provided regarding the importance of this vaccine. Advised may receive this vaccine at local pharmacy or Health Dept. Aware to provide a copy of the vaccination record if obtained from local pharmacy or Health Dept. Verbalized acceptance and understanding.  Pneumococcal vaccine status: Up to date  Covid-19 vaccine status: Completed vaccines  Qualifies for Shingles Vaccine? Yes   Zostavax completed No   Shingrix Completed?: No.    Education has been provided regarding the importance of this vaccine. Patient has been advised to call insurance company to determine out of pocket expense if they have not yet received this vaccine. Advised may also receive vaccine at local pharmacy or Health Dept. Verbalized acceptance and understanding.  Screening Tests Health Maintenance  Topic Date Due   Zoster Vaccines- Shingrix (1 of 2) Never done   MAMMOGRAM  10/06/2015   INFLUENZA VACCINE  06/15/2021   Hepatitis C Screening  12/23/2021 (Originally 07/16/1969)   COLONOSCOPY (Pts 45-69yrs Insurance coverage will need to be confirmed)  05/02/2022   TETANUS/TDAP  08/25/2027   DEXA SCAN  Completed   COVID-19 Vaccine  Completed   PNA vac Low Risk Adult  Completed   HPV VACCINES  Aged Out    Health Maintenance  Health Maintenance Due  Topic Date Due   Zoster Vaccines- Shingrix (1 of 2) Never done   MAMMOGRAM  10/06/2015   INFLUENZA VACCINE  06/15/2021    Colorectal cancer screening: Type of screening: Colonoscopy. Completed 05/02/17. Repeat every 5 years  Mammogram status: Ordered 04/16/21. Pt provided with contact info and advised to call to schedule appt.   Bone Density status: Completed 10/29/13. Results reflect: Bone density results: OSTEOPENIA. Repeat every 2 years.  Lung Cancer Screening: (Low Dose CT Chest recommended if Age 17-80 years, 30 pack-year currently smoking OR have quit w/in 15years.) does not qualify.   Lung Cancer Screening Referral:  N/A  Additional Screening:  Hepatitis C Screening: does not qualify; Completed N/A  Vision Screening: Recommended annual ophthalmology exams for early detection of glaucoma  and other disorders of the eye. Is the patient up to date with their annual eye exam?  Yes  Who is the provider or what is the name of the office in which the patient attends annual eye exams? Dr. Clydene Pugh If pt is not established with a provider, would they like to be referred to a provider to establish care?  N/A .   Dental Screening: Recommended annual dental exams for proper oral hygiene  Community Resource Referral / Chronic Care Management: CRR required this visit?  No   CCM required this visit?  No      Plan:     I have personally reviewed and noted the following in the patient's chart:   Medical and social history Use of alcohol, tobacco or illicit drugs  Current medications and supplements including opioid prescriptions.  Functional ability and status Nutritional status Physical activity Advanced directives List of other physicians Hospitalizations, surgeries, and ER visits in previous 12 months Vitals Screenings to include cognitive, depression, and falls Referrals and appointments  In addition, I have reviewed and discussed with patient certain preventive protocols, quality metrics, and best practice recommendations. A written personalized care plan for preventive services as well as general preventive health recommendations were provided to patient.   Ms. Vanderkolk , Thank you for taking time to come for your Medicare Wellness Visit. I appreciate your ongoing commitment to your health goals. Please review the following plan we discussed and let me know if I can assist you in the future.   These are the goals we discussed:  Goals   None     This is a list of the screening recommended for you and due dates:  Health Maintenance  Topic Date Due   Zoster (Shingles) Vaccine (1 of 2) Never done    Mammogram  10/06/2015   Flu Shot  06/15/2021   Hepatitis C Screening: USPSTF Recommendation to screen - Ages 18-79 yo.  12/23/2021*   Colon Cancer Screening  05/02/2022   Tetanus Vaccine  08/25/2027   DEXA scan (bone density measurement)  Completed   COVID-19 Vaccine  Completed   Pneumonia vaccines  Completed   HPV Vaccine  Aged Out  *Topic was postponed. The date shown is not the original due date.        Pablo Ledger, [place]   07/29/2021   Nurse Notes: Non Face to Face 60 minutes

## 2021-08-07 ENCOUNTER — Encounter: Payer: PRIVATE HEALTH INSURANCE | Admitting: Nurse Practitioner

## 2021-08-25 ENCOUNTER — Other Ambulatory Visit: Payer: Self-pay

## 2021-08-25 ENCOUNTER — Ambulatory Visit (INDEPENDENT_AMBULATORY_CARE_PROVIDER_SITE_OTHER): Payer: Medicare Other | Admitting: Nurse Practitioner

## 2021-08-25 ENCOUNTER — Encounter: Payer: Self-pay | Admitting: Nurse Practitioner

## 2021-08-25 VITALS — BP 126/85 | HR 67 | Ht 65.0 in | Wt 157.0 lb

## 2021-08-25 DIAGNOSIS — M85852 Other specified disorders of bone density and structure, left thigh: Secondary | ICD-10-CM | POA: Diagnosis not present

## 2021-08-25 DIAGNOSIS — F5101 Primary insomnia: Secondary | ICD-10-CM | POA: Diagnosis not present

## 2021-08-25 DIAGNOSIS — Z79899 Other long term (current) drug therapy: Secondary | ICD-10-CM

## 2021-08-25 DIAGNOSIS — E559 Vitamin D deficiency, unspecified: Secondary | ICD-10-CM

## 2021-08-25 DIAGNOSIS — F419 Anxiety disorder, unspecified: Secondary | ICD-10-CM | POA: Diagnosis not present

## 2021-08-25 DIAGNOSIS — R151 Fecal smearing: Secondary | ICD-10-CM

## 2021-08-25 DIAGNOSIS — Z1159 Encounter for screening for other viral diseases: Secondary | ICD-10-CM

## 2021-08-25 DIAGNOSIS — N760 Acute vaginitis: Secondary | ICD-10-CM

## 2021-08-25 DIAGNOSIS — Z23 Encounter for immunization: Secondary | ICD-10-CM | POA: Diagnosis not present

## 2021-08-25 DIAGNOSIS — B9689 Other specified bacterial agents as the cause of diseases classified elsewhere: Secondary | ICD-10-CM

## 2021-08-25 DIAGNOSIS — E782 Mixed hyperlipidemia: Secondary | ICD-10-CM

## 2021-08-25 DIAGNOSIS — R159 Full incontinence of feces: Secondary | ICD-10-CM | POA: Insufficient documentation

## 2021-08-25 DIAGNOSIS — Z0000 Encounter for general adult medical examination without abnormal findings: Secondary | ICD-10-CM

## 2021-08-25 LAB — WET PREP FOR TRICH, YEAST, CLUE
Clue Cell Exam: NEGATIVE
Trichomonas Exam: NEGATIVE
Yeast Exam: NEGATIVE

## 2021-08-25 MED ORDER — LORAZEPAM 1 MG PO TABS: 1.0000 mg | ORAL_TABLET | Freq: Every evening | ORAL | 2 refills | Status: DC | PRN

## 2021-08-25 NOTE — Assessment & Plan Note (Signed)
Chronic, stable with minimal use of Ativan.  #30 pills lasts one year most often on PDMP review and discussion with patient.  She is aware of risks of benzo use chronically.  UDS today, obtain contract next visit.  Denies SI/HI.

## 2021-08-25 NOTE — Progress Notes (Signed)
BP 126/85   Pulse 67   Ht  (1.651 m)   Wt 157 lb (71.2 kg)   LMP  (LMP Unknown)   BMI 26.13 kg/m    Subjective:    Patient ID: Kristen May, female    DOB: Jan 30, 1951, 70 y.o.   MRN: [id number]  HPI: Kristen May is a 70 y.o. female presenting on 08/25/2021 for comprehensive medical examination. Current medical complaints include:none  She currently lives with: husband Menopausal Symptoms: no   She does report some occasional noticing of protrusion of something from anus -- it does not hurt or cause discomfort.  Does have occasional leakage of mucus -- notices this every day.  She is being followed by [facility] and will follow-up with them.  History of frequent BV infection, was treated for several months with Flagyl inserts.  She would like this rechecked today.  OSTEOPENIA Noted on past DEXA -= 10/29/2013  The  Z score is -0.1 and the T score is -1.6 in spine.   Adequate calcium & vitamin D: yes Weight bearing exercises: yes   INSOMNIA Used to take Ambien, but has not taken in many years.   Duration: chronic Satisfied with sleep quality: yes Difficulty falling asleep: yes Difficulty staying asleep: no Waking a few hours after sleep onset: no Early morning awakenings: no Daytime hypersomnolence: no Wakes feeling refreshed: yes Good sleep hygiene: yes Apnea: no Snoring: no Depressed/anxious mood: no Recent stress: no Restless legs/nocturnal leg cramps: no Chronic pain/arthritis: no History of sleep study: no Treatments attempted: [employer] focus.   Hyperlipidemia status: good compliance Satisfied with current treatment?  yes Side effects:  no Medication compliance: good compliance Supplements: none Aspirin:  no The 10-year ASCVD risk score (Arnett DK, et al., 2019) is: 8.2%   Values used to calculate the score:     Age: 21 years     Sex: Female     Is Non-Hispanic African American: No     Diabetic: No     Tobacco smoker: No      Systolic Blood Pressure: 126 mmHg     Is BP treated: No     HDL Cholesterol: 75 mg/dL     Total Cholesterol: 240 mg/dL Chest pain:  no Coronary artery disease:  no Family history CAD:  no Family history early CAD:  no  DEPRESSION At present takes Ativan as needed, has been on for long period -- takes only 1/2 a pill most often.  She rarely takes this, gets refills once a year #30 pills -- but would like refills on this.  Pt is aware of risks of benzo medication use to include increased sedation, respiratory suppression, falls, dependence and cardiovascular events. Pt would like to continue treatment as benefit determined to outweigh risk.  Last filled 01/20/21. Mood status: stable Satisfied with current treatment?: yes Symptom severity: mild  Duration of current treatment : chronic Side effects: no Medication compliance: good compliance Psychotherapy/counseling: none Depressed mood: no Anxious mood: no Anhedonia: no Significant weight loss or gain: no Insomnia: yes hard to fall asleep on occasion Fatigue: no Feelings of worthlessness or guilt: no Impaired concentration/indecisiveness: no Suicidal ideations: no Hopelessness: no Crying spells: no Depression screen [facility] 2/9 08/25/2021 07/29/2021 12/23/2020 03/07/2020 08/24/2017  Decreased Interest 0 0 0 0 0  Down, Depressed, Hopeless 0 0 0 0 0  PHQ - 2 Score 0 0 0 0 0  Altered sleeping 0 - - - 0  Tired, decreased  energy 0 - - - 0  Change in appetite 0 - - - 0  Feeling bad or failure about yourself  0 - - - 0  Trouble concentrating 0 - - - 0  Moving slowly or fidgety/restless 0 - - - 0  Suicidal thoughts 0 - - - 0  PHQ-9 Score 0 - - - 0  Difficult doing work/chores Not difficult at all - - - -   GAD 7 : Generalized Anxiety Score 08/25/2021  Nervous, Anxious, on Edge 1  Control/stop worrying 1  Worry too much - different things 0  Trouble relaxing 0  Restless 0  Easily annoyed or irritable 0  Afraid - awful might happen 0   Total GAD 7 Score 2  Anxiety Difficulty Not difficult at all    Fall Risk  08/25/2021 07/29/2021 12/23/2020 03/07/2020 08/24/2017  Falls in the past year? 0 0 0 0 No  Number falls in past yr: 0 0 - 0 -  Injury with Fall? 0 0 - 0 -  Risk for fall due to : No Fall Risks No Fall Risks - - -  Follow up Education provided Falls evaluation completed - Falls evaluation completed -    Functional Status Survey: Is the patient deaf or have difficulty hearing?: No Does the patient have difficulty seeing, even when wearing glasses/contacts?: No Does the patient have difficulty concentrating, remembering, or making decisions?: No Does the patient have difficulty walking or climbing stairs?: No Does the patient have difficulty dressing or bathing?: No Does the patient have difficulty doing errands alone such as visiting a doctor's office or shopping?: No   Past Medical History:  Past Medical History:  Diagnosis Date   Anxiety    Back pain    Cervicalgia    Depression    Elevated vitamin B12 level    Insomnia    Lumbago    Osteopenia     Surgical History:  Past Surgical History:  Procedure Laterality Date   CHOLECYSTECTOMY     CHOLESTEATOMA EXCISION     eye surgerry     SKIN GRAFT      Medications:  Current Outpatient Medications on File Prior to Visit  Medication Sig   metroNIDAZOLE (METROGEL VAGINAL) 0.75 % vaginal gel Place one applicator full daily vaginally at night for 10 days, then switch to placing one applicator full vaginally at night twice a week for the next 3 months.   nystatin cream (MYCOSTATIN)    No current facility-administered medications on file prior to visit.    Allergies:  Allergies  Allergen Reactions   Morphine And Related Itching    Social History:  Social History   Socioeconomic History   Marital status: Widowed    Spouse name: Not on file   Number of children: Not on file   Years of education: Not on file   Highest education level: Not on file   Occupational History   Not on file  Tobacco Use   Smoking status: Former   Smokeless tobacco: Never  Vaping Use   Vaping Use: Never used  Substance and Sexual Activity   Alcohol use: Not Currently    Alcohol/week: 0.0 standard drinks    Comment: on occasion   Drug use: No   Sexual activity: Yes  Other Topics Concern   Not on file  Social History Narrative   Not on file   Social Determinants of Health   Financial Resource Strain: Low Risk    Difficulty of Paying  Living Expenses: Not hard at all  Food Insecurity: No Food Insecurity   Worried About Running Out of Food in the Last Year: Never true   Ran Out of Food in the Last Year: Never true  Transportation Needs: No Transportation Needs   Lack of Transportation (Medical): No   Lack of Transportation (Non-Medical): No  Physical Activity: Insufficiently Active   Days of Exercise per Week: 4 days   Minutes of Exercise per Session: 30 min  Stress: No Stress Concern Present   Feeling of Stress : Not at all  Social Connections: Moderately Integrated   Frequency of Communication with Friends and Family: Once a week   Frequency of Social Gatherings with Friends and Family: Three times a week   Attends Religious Services: Never   Active Member of Clubs or Organizations: Yes   Attends Banker Meetings: Never   Marital Status: Living with partner  Intimate Partner Violence: Not At Risk   Fear of Current or Ex-Partner: No   Emotionally Abused: No   Physically Abused: No   Sexually Abused: No   Social History   Tobacco Use  Smoking Status Former  Smokeless Tobacco Never   Social History   Substance and Sexual Activity  Alcohol Use Not Currently   Alcohol/week: 0.0 standard drinks   Comment: on occasion    Family History:  Family History  Problem Relation Age of Onset   Cancer Mother        stomach   COPD Mother    Heart disease Father    Cancer Father        colon   Diabetes Father    Polycystic  ovary syndrome Daughter    Cancer Paternal Grandfather        rectal   Depression Daughter    Bipolar disorder Daughter     Past medical history, surgical history, medications, allergies, family history and social history reviewed with patient today and changes made to appropriate areas of the chart.   Review of Systems - negative All other ROS negative except what is listed above and in the HPI.      Objective:    BP 126/85   Pulse 67   Ht 5\' 5"  (1.651 m)   Wt 157 lb (71.2 kg)   LMP  (LMP Unknown)   BMI 26.13 kg/m   Wt Readings from Last 3 Encounters:  08/25/21 157 lb (71.2 kg)  02/16/21 157 lb (71.2 kg)  02/04/21 157 lb 12.8 oz (71.6 kg)    Physical Exam Vitals and nursing note reviewed. Exam conducted with a chaperone present.  Constitutional:      General: She is awake. She is not in acute distress.    Appearance: She is well-developed. She is not ill-appearing.  HENT:     Head: Normocephalic and atraumatic.     Right Ear: Hearing, tympanic membrane, ear canal and external ear normal. No drainage.     Left Ear: Hearing, tympanic membrane, ear canal and external ear normal. No drainage.     Nose: Nose normal.     Right Sinus: No maxillary sinus tenderness or frontal sinus tenderness.     Left Sinus: No maxillary sinus tenderness or frontal sinus tenderness.     Mouth/Throat:     Mouth: Mucous membranes are moist.     Pharynx: Oropharynx is clear. Uvula midline. No pharyngeal swelling, oropharyngeal exudate or posterior oropharyngeal erythema.  Eyes:     General: Lids are normal.  Right eye: No discharge.        Left eye: No discharge.     Extraocular Movements: Extraocular movements intact.     Conjunctiva/sclera: Conjunctivae normal.     Pupils: Pupils are equal, round, and reactive to light.     Visual Fields: Right eye visual fields normal and left eye visual fields normal.  Neck:     Thyroid: No thyromegaly.     Vascular: No carotid bruit.      Trachea: Trachea normal.  Cardiovascular:     Rate and Rhythm: Normal rate and regular rhythm.     Heart sounds: Normal heart sounds. No murmur heard.   No gallop.  Pulmonary:     Effort: Pulmonary effort is normal. No accessory muscle usage or respiratory distress.     Breath sounds: Normal breath sounds.  Chest:  Breasts:    Right: Normal.     Left: Normal.  Abdominal:     General: Bowel sounds are normal.     Palpations: Abdomen is soft. There is no hepatomegaly or splenomegaly.     Tenderness: There is no abdominal tenderness.  Musculoskeletal:        General: Normal range of motion.     Cervical back: Normal range of motion and neck supple.     Right lower leg: No edema.     Left lower leg: No edema.  Lymphadenopathy:     Head:     Right side of head: No submental, submandibular, tonsillar, preauricular or posterior auricular adenopathy.     Left side of head: No submental, submandibular, tonsillar, preauricular or posterior auricular adenopathy.     Cervical: No cervical adenopathy.     Upper Body:     Right upper body: No supraclavicular, axillary or pectoral adenopathy.     Left upper body: No supraclavicular, axillary or pectoral adenopathy.  Skin:    General: Skin is warm and dry.     Capillary Refill: Capillary refill takes less than 2 seconds.     Findings: No rash.  Neurological:     Mental Status: She is alert and oriented to person, place, and time.     Cranial Nerves: Cranial nerves are intact.     Gait: Gait is intact.     Deep Tendon Reflexes: Reflexes are normal and symmetric.     Reflex Scores:      Brachioradialis reflexes are 2+ on the right side and 2+ on the left side.      Patellar reflexes are 2+ on the right side and 2+ on the left side. Psychiatric:        Attention and Perception: Attention normal.        Mood and Affect: Mood normal.        Speech: Speech normal.        Behavior: Behavior normal. Behavior is cooperative.        Thought  Content: Thought content normal.        Judgment: Judgment normal.   Results for orders placed or performed in visit on 03/06/21  WET PREP FOR TRICH, YEAST, CLUE   Specimen: Sterile Swab   Sterile Swab  Result Value Ref Range   Trichomonas Exam Negative Negative   Yeast Exam Negative Negative   Clue Cell Exam Positive (A) Negative  Urine Culture   Specimen: Urine   UR  Result Value Ref Range   Urine Culture, Routine Final report    Organism ID, Bacteria Comment   Microscopic Examination  Urine  Result Value Ref Range   WBC, UA 0-5 0 - 5 /hpf   RBC None seen 0 - 2 /hpf   Epithelial Cells (non renal) 0-10 0 - 10 /hpf   Mucus, UA Present (A) Not Estab.   Bacteria, UA None seen None seen/Few  Urinalysis, Routine w reflex microscopic  Result Value Ref Range   Specific Gravity, UA 1.020 1.005 - 1.030   pH, UA 5.5 5.0 - 7.5   Color, UA Yellow Yellow   Appearance Ur Cloudy (A) Clear   Leukocytes,UA Trace (A) Negative   Protein,UA Negative Negative/Trace   Glucose, UA Negative Negative   Ketones, UA Negative Negative   RBC, UA Negative Negative   Bilirubin, UA Negative Negative   Urobilinogen, Ur 0.2 0.2 - 1.0 mg/dL   Nitrite, UA Negative Negative   Microscopic Examination See below:       Assessment & Plan:   Problem List Items Addressed This Visit       Musculoskeletal and Integument   Osteopenia    Last DEXA 2014 with osteopenia noted on DEXA, recommend repeat DEXA in upcoming year.  Recommend taking Vitamin D 2000 units at home and continue daily calcium.  Check Vit D and CMP today.        Other   Insomnia    Ongoing, stable without any further use of Ambien.  Continue self care and relaxation at night.      Relevant Orders   CBC with Differential/Platelet   Anxiety    Chronic, stable with minimal use of Ativan.  #30 pills lasts one year most often on PDMP review and discussion with patient.  She is aware of risks of benzo use chronically.  UDS today, obtain  contract next visit.  Denies SI/HI.      Relevant Medications   LORazepam (ATIVAN) 1 MG tablet   Hyperlipidemia - Primary    Currently diet controlled with recent ASCVD 8.2%.  Continue diet focus and exercise -- recheck lipid panel today.      Relevant Orders   Comprehensive metabolic panel   Lipid Panel w/o Chol/HDL Ratio   TSH   Vitamin D deficiency    Chronic, ongoing.  Recommend Vitamin D3 2000 units daily, check level today.      Relevant Orders   VITAMIN D 25 Hydroxy (Vit-D Deficiency, Fractures)   Stool incontinence    Reports occasional leakage -- plans to follow-up with [facility].      Other Visit Diagnoses     BV (bacterial vaginosis)       History of frequent BV infections, checked today and negative clue cells.   Relevant Orders   WET PREP FOR TRICH, YEAST, CLUE   High risk medication use       UDS on labs today due to Ativan use.   Relevant Orders   [id number] 11+Oxyco+Alc+Crt-Bund   Need for hepatitis C screening test       Hep C screening on labs today, discussed with patient.   Relevant Orders   Hepatitis C antibody   Flu vaccine need       Flu vaccine today.   Relevant Orders   Flu Vaccine QUAD High Dose(Fluad) (Completed)   Encounter for annual physical exam       Annual exam today and labs checked + health maintenance reviewed -- refuses mammogram.        Follow up plan: Return in about 6 months (around 02/23/2022) for MOOD.   LABORATORY TESTING:  -  Pap smear: not applicable  IMMUNIZATIONS:   - Tdap: Tetanus vaccination status reviewed: last tetanus booster within 10 years. - Influenza: Up to date - Pneumovax: Up to date - Prevnar: Up to date - HPV: Not applicable - Zostavax vaccine:  wishes to talk to insurance first  SCREENING: -Mammogram: Ordered today  - Colonoscopy: Up to date  - Bone Density: Up to date  -Hearing Test: Not applicable  -Spirometry: Not applicable   PATIENT COUNSELING:   Advised to take 1 mg of folate supplement  per day if capable of pregnancy.   Sexuality: Discussed sexually transmitted diseases, partner selection, use of condoms, avoidance of unintended pregnancy  and contraceptive alternatives.   Advised to avoid cigarette smoking.  I discussed with the patient that most people either abstain from alcohol or drink within safe limits (<=14/week and <=4 drinks/occasion for males, <=7/weeks and <= 3 drinks/occasion for females) and that the risk for alcohol disorders and other health effects rises proportionally with the number of drinks per week and how often a drinker exceeds daily limits.  Discussed cessation/primary prevention of drug use and availability of treatment for abuse.   Diet: Encouraged to adjust caloric intake to maintain  or achieve ideal body weight, to reduce intake of dietary saturated fat and total fat, to limit sodium intake by avoiding high sodium foods and not adding table salt, and to maintain adequate dietary potassium and calcium preferably from fresh fruits, vegetables, and low-fat dairy products.    Stressed the importance of regular exercise  Injury prevention: Discussed safety belts, safety helmets, smoke detector, smoking near bedding or upholstery.   Dental health: Discussed importance of regular tooth brushing, flossing, and dental visits.    NEXT PREVENTATIVE PHYSICAL DUE IN 1 YEAR. Return in about 6 months (around 02/23/2022) for MOOD.

## 2021-08-25 NOTE — Assessment & Plan Note (Signed)
Chronic, ongoing.  Recommend Vitamin D3 2000 units daily, check level today. 

## 2021-08-25 NOTE — Assessment & Plan Note (Signed)
Currently diet controlled with recent ASCVD 8.2%.  Continue diet focus and exercise -- recheck lipid panel today.

## 2021-08-25 NOTE — Assessment & Plan Note (Signed)
Reports occasional leakage -- plans to follow-up with [facility].

## 2021-08-25 NOTE — Assessment & Plan Note (Addendum)
Last DEXA 2014 with osteopenia noted on DEXA, recommend repeat DEXA in upcoming year.  Recommend taking Vitamin D 2000 units at home and continue daily calcium.  Check Vit D and CMP today.

## 2021-08-25 NOTE — Assessment & Plan Note (Signed)
Ongoing, stable without any further use of Ambien.  Continue self care and relaxation at night.

## 2021-08-25 NOTE — Patient Instructions (Signed)
[facility]  Address: [address], [place], [place] [address]  Phone: [phone]  Mammogram A mammogram is an X-ray of the breasts. This is done to check for changes that are not normal. This test can look for changes that may be caused by breast cancer or other problems. Mammograms are regularly done on women beginning at age 69. A man may have a mammogram if he has a lump or swelling in his breast. Tell a doctor: About any allergies you have. If you have breast implants. If you have had breast disease, biopsy, or surgery. If you have a family history of breast cancer. If you are breastfeeding. Whether you are pregnant or may be pregnant. What are the risks? Generally, this is a safe procedure. But problems may occur, including: Being exposed to radiation. Radiation levels are very low with this test. The need for more tests. The results were not read properly. Trouble finding breast cancer in women with dense breasts. What happens before the test? Have this test done about 1-2 weeks after your menstrual period. This is often when your breasts are the least tender. If you are visiting a new doctor or clinic, have any past mammogram images sent to your new doctor's office. Wash your breasts and under your arms on the day of the test. Do not use deodorants, perfumes, lotions, or powders on the day of the test. Take off any jewelry from your neck. Wear clothes that you can change into and out of easily. What happens during the test?  You will take off your clothes from the waist up. You will put on a gown. You will stand in front of the X-ray machine. Each breast will be placed between two plastic or glass plates. The plates will press down on your breast for a few seconds. Try to relax. This does not cause any harm to your breasts. It may not feel comfortable, but it will be very brief. X-rays will be taken from different angles of each  breast. The procedure may vary among doctors and hospitals. What can I expect after the test? The mammogram will be read by a specialist (radiologist). You may need to do parts of the test again. This depends on the quality of the images. You may go back to your normal activities. It is up to you to get the results of your test. Ask how to get your results when they are ready. Summary A mammogram is an X-ray of the breasts. It looks for changes that may be caused by breast cancer or other problems. A man may have this test if he has a lump or swelling in his breast. Before the test, tell your doctor about any breast problems that you have had in the past. Have this test done about 1-2 weeks after your menstrual period. Ask when your test results will be ready. Make sure you get your test results. This information is not intended to replace advice given to you by your health care provider. Make sure you discuss any questions you have with your health care provider. Document Revised: 09/01/2020 Document Reviewed: 09/01/2020 Elsevier Patient Education  2022 Elsevier Inc.   

## 2021-08-26 LAB — CBC WITH DIFFERENTIAL/PLATELET
Basophils Absolute: 0.1 10*3/uL (ref 0.0–0.2)
Basos: 1 %
EOS (ABSOLUTE): 0.4 10*3/uL (ref 0.0–0.4)
Eos: 7 %
Hematocrit: 40.9 % (ref 34.0–46.6)
Hemoglobin: 13.9 g/dL (ref 11.1–15.9)
Immature Grans (Abs): 0 10*3/uL (ref 0.0–0.1)
Immature Granulocytes: 0 %
Lymphocytes Absolute: 2.5 10*3/uL (ref 0.7–3.1)
Lymphs: 39 %
MCH: 32.7 pg (ref 26.6–33.0)
MCHC: 34 g/dL (ref 31.5–35.7)
MCV: 96 fL (ref 79–97)
Monocytes Absolute: 0.6 10*3/uL (ref 0.1–0.9)
Monocytes: 9 %
Neutrophils Absolute: 2.8 10*3/uL (ref 1.4–7.0)
Neutrophils: 44 %
Platelets: 278 10*3/uL (ref 150–450)
RBC: 4.25 x10E6/uL (ref 3.77–5.28)
RDW: 11.7 % (ref 11.7–15.4)
WBC: 6.3 10*3/uL (ref 3.4–10.8)

## 2021-08-26 LAB — COMPREHENSIVE METABOLIC PANEL
ALT: 20 IU/L (ref 0–32)
AST: 17 IU/L (ref 0–40)
Albumin/Globulin Ratio: 2 (ref 1.2–2.2)
Albumin: 4.6 g/dL (ref 3.8–4.8)
Alkaline Phosphatase: 63 IU/L (ref 44–121)
BUN/Creatinine Ratio: 15 (ref 12–28)
BUN: 14 mg/dL (ref 8–27)
Bilirubin Total: 0.6 mg/dL (ref 0.0–1.2)
CO2: 22 mmol/L (ref 20–29)
Calcium: 9.7 mg/dL (ref 8.7–10.3)
Chloride: 101 mmol/L (ref 96–106)
Creatinine, Ser: 0.92 mg/dL (ref 0.57–1.00)
Globulin, Total: 2.3 g/dL (ref 1.5–4.5)
Glucose: 77 mg/dL (ref 70–99)
Potassium: 4.4 mmol/L (ref 3.5–5.2)
Sodium: 138 mmol/L (ref 134–144)
Total Protein: 6.9 g/dL (ref 6.0–8.5)
eGFR: 67 mL/min/{1.73_m2} (ref >59)

## 2021-08-26 LAB — DRUG SCREEN 764883 11+OXYCO+ALC+CRT-BUND
Amphetamines, Urine: NEGATIVE ng/mL
BENZODIAZ UR QL: NEGATIVE ng/mL
Barbiturate: NEGATIVE ng/mL
Cannabinoid Quant, Ur: NEGATIVE ng/mL
Cocaine (Metabolite): NEGATIVE ng/mL
Creatinine: 34.2 mg/dL (ref 20.0–300.0)
Ethanol: NEGATIVE %
Meperidine: NEGATIVE ng/mL
Methadone Screen, Urine: NEGATIVE ng/mL
OPIATE SCREEN URINE: NEGATIVE ng/mL
Oxycodone/Oxymorphone, Urine: NEGATIVE ng/mL
Phencyclidine: NEGATIVE ng/mL
Propoxyphene: NEGATIVE ng/mL
Tramadol: NEGATIVE ng/mL
pH, Urine: 5.6 (ref 4.5–8.9)

## 2021-08-26 LAB — LIPID PANEL W/O CHOL/HDL RATIO
Cholesterol, Total: 233 mg/dL — ABNORMAL HIGH (ref 100–199)
HDL: 73 mg/dL (ref >39)
LDL Chol Calc (NIH): 130 mg/dL — ABNORMAL HIGH (ref 0–99)
Triglycerides: 173 mg/dL — ABNORMAL HIGH (ref 0–149)
VLDL Cholesterol Cal: 30 mg/dL (ref 5–40)

## 2021-08-26 LAB — TSH: TSH: 1.19 u[IU]/mL (ref 0.450–4.500)

## 2021-08-26 LAB — HEPATITIS C ANTIBODY: Hep C Virus Ab: <0.1 s/co ratio (ref 0.0–0.9)

## 2021-08-26 LAB — VITAMIN D 25 HYDROXY (VIT D DEFICIENCY, FRACTURES): Vit D, 25-Hydroxy: 21 ng/mL — ABNORMAL LOW (ref 30.0–100.0)

## 2021-09-01 DIAGNOSIS — R151 Fecal smearing: Secondary | ICD-10-CM

## 2022-02-23 ENCOUNTER — Ambulatory Visit: Payer: Medicare Other | Admitting: Nurse Practitioner

## 2022-03-15 ENCOUNTER — Other Ambulatory Visit: Payer: Self-pay | Admitting: Nurse Practitioner

## 2022-03-16 ENCOUNTER — Ambulatory Visit: Payer: Self-pay | Admitting: *Deleted

## 2022-03-16 ENCOUNTER — Other Ambulatory Visit: Payer: Self-pay | Admitting: Nurse Practitioner

## 2022-03-16 MED ORDER — LORAZEPAM 1 MG PO TABS: 1.0000 mg | ORAL_TABLET | Freq: Every evening | ORAL | 2 refills | Status: DC | PRN

## 2022-03-16 NOTE — Telephone Encounter (Signed)
Requested medication (s) are due for refill today - yes ? ?Requested medication (s) are on the active medication list -yes ? ?Future visit scheduled -yes ? ?Last refill: 08/25/21 #30 2RF ? ?Notes to clinic: Request RF: non delegated Rx ? ?Requested Prescriptions  ?Pending Prescriptions Disp Refills  ? LORazepam (ATIVAN) 1 MG tablet [Pharmacy Med Name: LORAZEPAM 1 MG TABLET] 30 tablet   ?  Sig: TAKE 1 TABLET BY MOUTH AT BEDTIME AS NEEDED FOR ANXIETY.  ?  ? Not Delegated - Psychiatry: Anxiolytics/Hypnotics 2 Failed - 03/16/2022  9:22 AM  ?  ?  Failed - This refill cannot be delegated  ?  ?  Failed - Valid encounter within last 6 months  ?  Recent Outpatient Visits   ? ?      ? 6 months ago Mixed hyperlipidemia  ? [place], Henrine Screws T, NP  ? 1 year ago Bacterial vaginosis  ? [place], Henrine Screws T, NP  ? 1 year ago Bacterial vaginosis  ? [place], Henrine Screws T, NP  ? 1 year ago Bacterial vaginosis  ? [place], [place]o T, NP  ? 1 year ago Aspiration pneumonia of right lower lobe due to regurgitated food [facility])  ? [facility], Henrine Screws T, NP  ? ?  ?  ?Future Appointments   ? ?        ? In 1 month [place]ady, Barbaraann Faster, NP [employer]  ? ?  ? ? ?  ?  ?  Passed - Urine Drug Screen completed in last 360 days  ?  ?  Passed - Patient is not pregnant  ?  ?  ? ? ? ?Requested Prescriptions  ?Pending Prescriptions Disp Refills  ? LORazepam (ATIVAN) 1 MG tablet [Pharmacy Med Name: LORAZEPAM 1 MG TABLET] 30 tablet   ?  Sig: TAKE 1 TABLET BY MOUTH AT BEDTIME AS NEEDED FOR ANXIETY.  ?  ? Not Delegated - Psychiatry: Anxiolytics/Hypnotics 2 Failed - 03/16/2022  9:22 AM  ?  ?  Failed - This refill cannot be delegated  ?  ?  Failed - Valid encounter within last 6 months  ?  Recent Outpatient Visits   ? ?      ? 6 months ago Mixed hyperlipidemia  ? [place], Henrine Screws T, NP  ? 1 year ago Bacterial vaginosis   ? [place], Henrine Screws T, NP  ? 1 year ago Bacterial vaginosis  ? [place], Henrine Screws T, NP  ? 1 year ago Bacterial vaginosis  ? [place], [place] T, NP  ? 1 year ago Aspiration pneumonia of right lower lobe due to regurgitated food [facility])  ? [facility] [place], Henrine Screws T, NP  ? ?  ?  ?Future Appointments   ? ?        ? In 1 month [place]ady, Barbaraann Faster, NP [employer]  ? ?  ? ? ?  ?  ?  Passed - Urine Drug Screen completed in last 360 days  ?  ?  Passed - Patient is not pregnant  ?  ?  ? ? ? ?

## 2022-03-16 NOTE — Telephone Encounter (Signed)
? ?  Notes to clinic: Duplicate request- non delegated Rx ? ?Requested Prescriptions  ?Pending Prescriptions Disp Refills  ? LORazepam (ATIVAN) 1 MG tablet [Pharmacy Med Name: LORAZEPAM 1 MG TABLET] 30 tablet   ?  Sig: TAKE 1 TABLET BY MOUTH AT BEDTIME AS NEEDED FOR ANXIETY.  ?  ? Not Delegated - Psychiatry: Anxiolytics/Hypnotics 2 Failed - 03/16/2022  9:22 AM  ?  ?  Failed - This refill cannot be delegated  ?  ?  Failed - Valid encounter within last 6 months  ?  Recent Outpatient Visits   ? ?      ? 6 months ago Mixed hyperlipidemia  ? [place], Henrine Screws T, NP  ? 1 year ago Bacterial vaginosis  ? [place], Henrine Screws T, NP  ? 1 year ago Bacterial vaginosis  ? [place], Henrine Screws T, NP  ? 1 year ago Bacterial vaginosis  ? [place], [place] T, NP  ? 1 year ago Aspiration pneumonia of right lower lobe due to regurgitated food [facility])  ? [facility] [place], Henrine Screws T, NP  ? ?  ?  ?Future Appointments   ? ?        ? In 1 month Cannady, Barbaraann Faster, NP [employer]  ? ?  ? ? ?  ?  ?  Passed - Urine Drug Screen completed in last 360 days  ?  ?  Passed - Patient is not pregnant  ?  ?  ? ? ? ?Requested Prescriptions  ?Pending Prescriptions Disp Refills  ? LORazepam (ATIVAN) 1 MG tablet [Pharmacy Med Name: LORAZEPAM 1 MG TABLET] 30 tablet   ?  Sig: TAKE 1 TABLET BY MOUTH AT BEDTIME AS NEEDED FOR ANXIETY.  ?  ? Not Delegated - Psychiatry: Anxiolytics/Hypnotics 2 Failed - 03/16/2022  9:22 AM  ?  ?  Failed - This refill cannot be delegated  ?  ?  Failed - Valid encounter within last 6 months  ?  Recent Outpatient Visits   ? ?      ? 6 months ago Mixed hyperlipidemia  ? [place], Henrine Screws T, NP  ? 1 year ago Bacterial vaginosis  ? [place], Henrine Screws T, NP  ? 1 year ago Bacterial vaginosis  ? [place], Henrine Screws T, NP  ? 1 year ago Bacterial vaginosis  ?  [place], [place] T, NP  ? 1 year ago Aspiration pneumonia of right lower lobe due to regurgitated food [facility])  ? [facility] [place], Henrine Screws T, NP  ? ?  ?  ?Future Appointments   ? ?        ? In 1 month Cannady, Barbaraann Faster, NP [employer]  ? ?  ? ? ?  ?  ?  Passed - Urine Drug Screen completed in last 360 days  ?  ?  Passed - Patient is not pregnant  ?  ?  ? ? ? ?

## 2022-03-16 NOTE — Telephone Encounter (Signed)
Pt went to refill this med, LORazepam (ATIVAN) 1 MG tablet ? ?the patient states she had one (1) refill on the bottle, but the pharmacy states this refill has expired. ?Pt has appt in June, but wanted to get this refilled once more ? ?[id number] - [place], [place] - [address] ? ? ? ?

## 2022-03-16 NOTE — Telephone Encounter (Signed)
Instructed by triage to send back to the practice since this is a controlled substance. Pt essentially wants enough to last her until her upcoming appt (after her colonoscopy)  ?

## 2022-03-16 NOTE — Telephone Encounter (Signed)
Routing to provider  

## 2022-03-16 NOTE — Telephone Encounter (Signed)
Opened chart to answer a question for agent regarding the Ativan.   Pt is calling in about it.  Jolene Cannady will need to address this as it has been refused by her CMA.   ? ? ?

## 2022-03-16 NOTE — Telephone Encounter (Signed)
Pt is requesting a call back for clarity, please advise  ?

## 2022-03-30 LAB — HM COLONOSCOPY

## 2022-03-31 ENCOUNTER — Encounter: Payer: Self-pay | Admitting: Nurse Practitioner

## 2022-04-24 NOTE — Patient Instructions (Signed)

## 2022-04-27 ENCOUNTER — Encounter: Payer: Self-pay | Admitting: Nurse Practitioner

## 2022-04-27 ENCOUNTER — Ambulatory Visit (INDEPENDENT_AMBULATORY_CARE_PROVIDER_SITE_OTHER): Payer: Medicare Other | Admitting: Nurse Practitioner

## 2022-04-27 VITALS — BP 111/73 | HR 63 | Temp 98.1°F | Wt 157.8 lb

## 2022-04-27 DIAGNOSIS — F419 Anxiety disorder, unspecified: Secondary | ICD-10-CM

## 2022-04-27 DIAGNOSIS — M542 Cervicalgia: Secondary | ICD-10-CM | POA: Diagnosis not present

## 2022-04-27 NOTE — Assessment & Plan Note (Signed)
Acute and overall improving.  At this time recommend Voltaren gel or CBD cream at home to neck as needed + continue her PT exercises at home.  Tylenol as needed + heat.  Return to office for worsening or ongoing.

## 2022-04-27 NOTE — Progress Notes (Signed)
BP 111/73   Pulse 63   Temp 98.1 F (36.7 C) (Oral)   Wt 157 lb 12.8 oz (71.6 kg)   LMP  (LMP Unknown)   SpO2 97%   BMI 26.26 kg/m    Subjective:    Patient ID: Kristen May, female    DOB: 01/04/1951, 71 y.o.   MRN: [id number]  HPI: Kristen May is a 71 y.o. female  Chief Complaint  Patient presents with   Mood    Patient is here to follow up on Mood. Patient denies having any concerns at today's visit.    Neck Pain    Patient states she has been experiencing a pinched nerve sensation in her neck and it comes and goes. Patient states when she goes to lift her arm, she notices some tingling that runs down both of her arms and she would like to discuss with provider at today's visit.    NECK PAIN  Started about one month ago, stayed out of town on fluffier pillows when started.  When lifting arms noted tingling that runs down both arms.  She feels it is getting better, no pins and needles over last week or so.   Status: stable Treatments attempted: Tylenol and stretches Relief with NSAIDs?:  No NSAIDs Taken Location:midline Duration:weeks Severity: 5/10 Quality: varies -- dull to sharp Frequency: intermittent Radiation:  tingling down arms Aggravating factors: lifting and movement Alleviating factors: rest and APAP Weakness:  no Paresthesias / decreased sensation:  no  Fevers:  no   ANXIETY & INSOMNIA At present takes Ativan as needed, has been on for long period -- takes only 1/2 a pill most often.  She rarely takes this, gets refills twice a year #30 pills.  Pt is aware of risks of benzo medication use to include increased sedation, respiratory suppression, falls, dependence and cardiovascular events. Pt would like to continue treatment as benefit determined to outweigh risk.  Last filled 03/16/22.  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  She remains off Ambien at this time and is continuing to focus on relaxation techniques for sleep at night. Mood status:  stable Satisfied with current treatment?: yes Symptom severity: mild  Duration of current treatment : chronic Side effects: no Medication compliance: good compliance Psychotherapy/counseling: in the past Previous psychiatric medications: none Depressed mood: no Anxious mood: yes when performing Anhedonia: no Significant weight loss or gain: no Insomnia: none Fatigue: no Feelings of worthlessness or guilt: no Impaired concentration/indecisiveness: no Suicidal ideations: no Hopelessness: no Crying spells: no    04/27/2022    3:09 PM 08/25/2021   11:26 AM 07/29/2021    5:10 PM 12/23/2020    2:10 PM 03/07/2020    9:12 AM  Depression screen PHQ 2/9  Decreased Interest 0 0 0 0 0  Down, Depressed, Hopeless 0 0 0 0 0  PHQ - 2 Score 0 0 0 0 0  Altered sleeping 0 0     Tired, decreased energy 0 0     Change in appetite 0 0     Feeling bad or failure about yourself  0 0     Trouble concentrating 0 0     Moving slowly or fidgety/restless 0 0     Suicidal thoughts 0 0     PHQ-9 Score 0 0     Difficult doing work/chores Not difficult at all Not difficult at all         04/27/2022    3:10 PM 08/25/2021   11:26  AM  GAD 7 : Generalized Anxiety Score  Nervous, Anxious, on Edge 0 1  Control/stop worrying 0 1  Worry too much - different things 0 0  Trouble relaxing 0 0  Restless 0 0  Easily annoyed or irritable 0 0  Afraid - awful might happen 0 0  Total GAD 7 Score 0 2  Anxiety Difficulty Not difficult at all Not difficult at all    Relevant past medical, surgical, family and social history reviewed and updated as indicated. Interim medical history since our last visit reviewed. Allergies and medications reviewed and updated.  Review of Systems  Constitutional:  Negative for activity change, appetite change, diaphoresis, fatigue and fever.  Respiratory:  Negative for cough, chest tightness and shortness of breath.   Cardiovascular:  Negative for chest pain, palpitations and leg  swelling.  Musculoskeletal:  Positive for neck pain.  Neurological: Negative.   Psychiatric/Behavioral: Negative.      Per HPI unless specifically indicated above     Objective:    BP 111/73   Pulse 63   Temp 98.1 F (36.7 C) (Oral)   Wt 157 lb 12.8 oz (71.6 kg)   LMP  (LMP Unknown)   SpO2 97%   BMI 26.26 kg/m   Wt Readings from Last 3 Encounters:  04/27/22 157 lb 12.8 oz (71.6 kg)  08/25/21 157 lb (71.2 kg)  02/16/21 157 lb (71.2 kg)    Physical Exam Vitals and nursing note reviewed.  Constitutional:      General: She is awake. She is not in acute distress.    Appearance: She is well-developed and well-groomed. She is not ill-appearing or toxic-appearing.  HENT:     Head: Normocephalic.     Right Ear: Hearing normal.     Left Ear: Hearing normal.     Nose: Nose normal.  Eyes:     General: Lids are normal.        Right eye: No discharge.        Left eye: No discharge.     Conjunctiva/sclera: Conjunctivae normal.     Pupils: Pupils are equal, round, and reactive to light.  Neck:     Thyroid: No thyromegaly.     Vascular: No carotid bruit.  Cardiovascular:     Rate and Rhythm: Normal rate and regular rhythm.     Heart sounds: Normal heart sounds. No murmur heard.    No gallop.  Pulmonary:     Effort: Pulmonary effort is normal. No accessory muscle usage or respiratory distress.     Breath sounds: Normal breath sounds.  Abdominal:     General: Bowel sounds are normal.     Palpations: Abdomen is soft.  Musculoskeletal:     Cervical back: Normal range of motion and neck supple. No rigidity or torticollis. No pain with movement, spinous process tenderness or muscular tenderness. Normal range of motion.     Right lower leg: No edema.     Left lower leg: No edema.  Lymphadenopathy:     Cervical: No cervical adenopathy.  Skin:    General: Skin is warm and dry.  Neurological:     Mental Status: She is alert and oriented to person, place, and time.  Psychiatric:         Attention and Perception: Attention normal.        Mood and Affect: Mood normal.        Speech: Speech normal.        Behavior: Behavior normal. Behavior is  cooperative.        Thought Content: Thought content normal.    Results for orders placed or performed in visit on 08/25/21  WET PREP FOR [place], YEAST, CLUE   Specimen: Sterile Swab   Sterile Swab  Result Value Ref Range   Trichomonas Exam Negative Negative   Yeast Exam Negative Negative   Clue Cell Exam Negative Negative  [id number] 11+Oxyco+Alc+Crt-Bund  Result Value Ref Range   Ethanol Negative Cutoff=0.020 %   Amphetamines, Urine Negative Cutoff=1000 ng/mL   Barbiturate Negative Cutoff=200 ng/mL   BENZODIAZ UR QL Negative Cutoff=200 ng/mL   Cannabinoid Quant, Ur Negative Cutoff=50 ng/mL   Cocaine (Metabolite) Negative Cutoff=300 ng/mL   OPIATE SCREEN URINE Negative Cutoff=300 ng/mL   Oxycodone/Oxymorphone, Urine Negative Cutoff=300 ng/mL   Phencyclidine Negative Cutoff=25 ng/mL   Methadone Screen, Urine Negative Cutoff=300 ng/mL   Propoxyphene Negative Cutoff=300 ng/mL   Meperidine Negative Cutoff=200 ng/mL   Tramadol Negative Cutoff=200 ng/mL   Creatinine 34.2 20.0 - 300.0 mg/dL   pH, Urine 5.6 4.5 - 8.9  CBC with Differential/Platelet  Result Value Ref Range   WBC 6.3 3.4 - 10.8 x10E3/uL   RBC 4.25 3.77 - 5.28 x10E6/uL   Hemoglobin 13.9 11.1 - 15.9 g/dL   Hematocrit 40.9 34.0 - 46.6 %   MCV 96 79 - 97 fL   MCH 32.7 26.6 - 33.0 pg   MCHC 34.0 31.5 - 35.7 g/dL   RDW 11.7 11.7 - 15.4 %   Platelets 278 150 - 450 x10E3/uL   Neutrophils 44 Not Estab. %   Lymphs 39 Not Estab. %   Monocytes 9 Not Estab. %   Eos 7 Not Estab. %   Basos 1 Not Estab. %   Neutrophils Absolute 2.8 1.4 - 7.0 x10E3/uL   Lymphocytes Absolute 2.5 0.7 - 3.1 x10E3/uL   Monocytes Absolute 0.6 0.1 - 0.9 x10E3/uL   EOS (ABSOLUTE) 0.4 0.0 - 0.4 x10E3/uL   Basophils Absolute 0.1 0.0 - 0.2 x10E3/uL   Immature Granulocytes 0 Not Estab. %    Immature Grans (Abs) 0.0 0.0 - 0.1 x10E3/uL  Comprehensive metabolic panel  Result Value Ref Range   Glucose 77 70 - 99 mg/dL   BUN 14 8 - 27 mg/dL   Creatinine, Ser 0.92 0.57 - 1.00 mg/dL   eGFR 67 >59 mL/min/1.73   BUN/Creatinine Ratio 15 12 - 28   Sodium 138 134 - 144 mmol/L   Potassium 4.4 3.5 - 5.2 mmol/L   Chloride 101 96 - 106 mmol/L   CO2 22 20 - 29 mmol/L   Calcium 9.7 8.7 - 10.3 mg/dL   Total Protein 6.9 6.0 - 8.5 g/dL   Albumin 4.6 3.8 - 4.8 g/dL   Globulin, Total 2.3 1.5 - 4.5 g/dL   Albumin/Globulin Ratio 2.0 1.2 - 2.2   Bilirubin Total 0.6 0.0 - 1.2 mg/dL   Alkaline Phosphatase 63 44 - 121 IU/L   AST 17 0 - 40 IU/L   ALT 20 0 - 32 IU/L  Lipid Panel w/o Chol/HDL Ratio  Result Value Ref Range   Cholesterol, Total 233 (H) 100 - 199 mg/dL   Triglycerides 173 (H) 0 - 149 mg/dL   HDL 73 >39 mg/dL   VLDL Cholesterol Cal 30 5 - 40 mg/dL   LDL Chol Calc (NIH) 130 (H) 0 - 99 mg/dL  TSH  Result Value Ref Range   TSH 1.190 0.450 - 4.500 uIU/mL  VITAMIN D 25 Hydroxy (Vit-D Deficiency, Fractures)  Result Value Ref  Range   Vit D, 25-Hydroxy 21.0 (L) 30.0 - 100.0 ng/mL  Hepatitis C antibody  Result Value Ref Range   Hep C Virus Ab <0.1 0.0 - 0.9 s/co ratio      Assessment & Plan:   Problem List Items Addressed This Visit       Other   Anxiety - Primary    Chronic, stable with minimal use of Ativan.  #30 pills lasts 6 months most often on PDMP review and discussion with patient.  She is aware of risks of benzo use chronically.  UDS due in October, obtain contract next visit.  Denies SI/HI.      Neck pain    Acute and overall improving.  At this time recommend Voltaren gel or CBD cream at home to neck as needed + continue her PT exercises at home.  Tylenol as needed + heat.  Return to office for worsening or ongoing.        Follow up plan: Return in about 4 months (around 08/26/2022) for Annual physical.

## 2022-04-27 NOTE — Assessment & Plan Note (Signed)
Chronic, stable with minimal use of Ativan.  #30 pills lasts 6 months most often on PDMP review and discussion with patient.  She is aware of risks of benzo use chronically.  UDS due in October, obtain contract next visit.  Denies SI/HI.

## 2022-06-14 ENCOUNTER — Encounter: Payer: Self-pay | Admitting: Nurse Practitioner

## 2022-06-20 NOTE — Patient Instructions (Addendum)
Please call to schedule your mammogram and/or bone density: [facility]  Address: [address], [place], [place] [address] Phone: [phone]  [facility] [address] [place],  [place]  [address] Phone: [phone]    Cervical Radiculopathy  Cervical radiculopathy happens when a nerve in the neck (a cervical nerve) is pinched or bruised. This condition can happen because of an injury to the cervical spine (vertebrae) in the neck, or as part of the normal aging process. Pressure on the cervical nerves can cause pain or numbness that travels from the neck all the way down to the arm and fingers. This condition usually gets better with rest. Treatment may be needed if the condition does not improve. What are the causes? This condition may be caused by: A neck injury. A bulging (herniated) disk. Muscle spasms. Muscle tightness in the neck due to overuse. Arthritis. Breakdown or degeneration in the bones and joints of the spine (spondylosis) due to aging. Bone spurs that may develop near the cervical nerves. What are the signs or symptoms? Symptoms of this condition include: Pain. The pain may travel from the neck to the arm and hand. The pain can be severe or irritating. It may get worse when you move your neck. Numbness or tingling in your arm or hand. Weakness in the affected arm and hand, in severe cases. How is this diagnosed? This condition may be diagnosed based on your symptoms, your medical history, and a physical exam. You may also have tests, including: X-rays. CT scan. MRI. Electromyogram (EMG). Nerve conduction tests. How is this treated? In many cases, treatment is not needed for this condition. With rest, the condition usually gets better over time. If treatment is needed, options may include: Wearing a soft neck collar (cervical collar) for short periods of time. Doing physical therapy  to strengthen your neck muscles. Taking medicines. These may include NSAIDs, such as ibuprofen, or oral corticosteroids. Having spinal injections, in severe cases. Having surgery. This may be needed if other treatments do not help. Different types of surgery may be done depending on the cause of this condition. Follow these instructions at home: If you have a cervical collar: Wear it as told by your health care provider. Remove it only as told by your health care provider. Ask your health care provider if you can remove the cervical collar for cleaning and bathing. If you are allowed to remove the collar for cleaning or bathing: Follow instructions from your health care provider about how to remove the collar safely. Clean the collar by wiping it with mild soap and water and drying it completely. Take out any removable pads in the collar every 1-2 days, and wash them by hand with soap and water. Let them air-dry completely before you put them back in the collar. Check your skin under the collar for irritation or sores. If you see any, tell your health care provider. Managing pain     Take over-the-counter and prescription medicines only as told by your health care provider. If directed, put ice on the affected area. To do this: If you have a soft neck collar, remove it as told by your health care provider. Put ice in a plastic bag. Place a towel between your skin and the bag. Leave the ice on for 20 minutes, 2-3 times a day. Remove the ice if your skin turns bright red. This is very important. If you  cannot feel pain, heat, or cold, you have a greater risk of damage to the area. If applying ice does not help, you can try using heat. Use the heat source that your health care provider recommends, such as a moist heat pack or a heating pad. Place a towel between your skin and the heat source. Leave the heat on for 20-30 minutes. Remove the heat if your skin turns bright red. This is especially  important if you are unable to feel pain, heat, or cold. You have a greater risk of getting burned. Try a gentle neck and shoulder massage to help relieve symptoms. Activity Rest as needed. Return to your normal activities as told by your health care provider. Ask your health care provider what activities are safe for you. Do stretching and strengthening exercises as told by your health care provider or your physical therapist. You may have to avoid lifting. Ask your health care provider how much you can safely lift. General instructions Use a flat pillow when you sleep. Do not drive while wearing a cervical collar. If you do not have a cervical collar, ask your health care provider if it is safe to drive while your neck heals. Ask your health care provider if the medicine prescribed to you requires you to avoid driving or using machinery. Do not use any products that contain nicotine or tobacco. These products include cigarettes, chewing tobacco, and vaping devices, such as e-cigarettes. If you need help quitting, ask your health care provider. Keep all follow-up visits. This is important. Contact a health care provider if: Your condition does not improve with treatment. Get help right away if: Your pain gets much worse and is not controlled with medicines. You have weakness or numbness in your hand, arm, face, or leg. You have a high fever. You have a stiff, rigid neck. You lose control of your bowels or your bladder (have incontinence). You have trouble with walking, balance, or speaking. Summary Cervical radiculopathy happens when a nerve in the neck is pinched or bruised. A nerve can get pinched from a bulging disk, arthritis, muscle spasms, or an injury to the neck. Symptoms include pain, tingling, or numbness radiating from the neck to the arm or hand. Weakness can also occur in severe cases. Treatment may include rest, wearing a cervical collar, and physical therapy. Medicines may  be prescribed to help with pain. In severe cases, injections or surgery may be needed. This information is not intended to replace advice given to you by your health care provider. Make sure you discuss any questions you have with your health care provider. Document Revised: 05/07/2021 Document Reviewed: 05/07/2021 Elsevier Patient Education  2023 [employer].

## 2022-06-23 ENCOUNTER — Ambulatory Visit
Admission: RE | Admit: 2022-06-23 | Discharge: 2022-06-23 | Disposition: A | Discharge status: Home / Self Care | Payer: Medicare Other | Source: Ambulatory Visit | Attending: Nurse Practitioner | Admitting: Nurse Practitioner

## 2022-06-23 ENCOUNTER — Ambulatory Visit (INDEPENDENT_AMBULATORY_CARE_PROVIDER_SITE_OTHER): Payer: Medicare Other | Admitting: Nurse Practitioner

## 2022-06-23 ENCOUNTER — Ambulatory Visit
Admission: RE | Admit: 2022-06-23 | Discharge: 2022-06-23 | Disposition: A | Discharge status: Home / Self Care | Payer: Medicare Other | Source: Home / Self Care | Attending: Nurse Practitioner | Admitting: Nurse Practitioner

## 2022-06-23 ENCOUNTER — Encounter: Payer: Self-pay | Admitting: Nurse Practitioner

## 2022-06-23 VITALS — BP 116/80 | HR 76 | Temp 98.5°F | Ht 65.0 in | Wt 155.9 lb

## 2022-06-23 DIAGNOSIS — M8588 Other specified disorders of bone density and structure, other site: Secondary | ICD-10-CM | POA: Diagnosis not present

## 2022-06-23 DIAGNOSIS — M542 Cervicalgia: Secondary | ICD-10-CM

## 2022-06-23 MED ORDER — PREDNISONE 10 MG PO TABS
ORAL_TABLET | ORAL | 0 refills | Status: DC
Start: 2022-06-23 — End: 2022-07-09

## 2022-06-23 NOTE — Progress Notes (Signed)
BP 116/80 (BP Location: Left Arm, Patient Position: Sitting)   Pulse 76   Temp 98.5 F (36.9 C) (Oral)   Ht _0  (1.651 m)   Wt 155 lb 14.4 oz (70.7 kg)   LMP  (LMP Unknown)   SpO2 97%   BMI 25.94 kg/m    Subjective:    Patient ID: Kristen May, female    DOB: [birth year], 71 y.o.   MRN: [id number]  HPI: Kristen May is a 71 y.o. female  Chief Complaint  Patient presents with   Neck Pain    Patient says this has been an ongoing issues since July. Patient says at the last visit, she was experiencing stabbing and tingling depending on certain movements or turns. Patient says when she tries to do the neck stretches she will have pain. Patient says she has been taking Tylenol. Patient says she has also been taking Ibuprofen and it helps a little, but understands it is not healthy for long term use.    NECK PAIN FOLLOW UP Pain has been ongoing since May when she had traveled.  At that time when lifting arms tingled down both sides, however this has improved.  Has underlying osteopenia.  Diagnosis: neck pain Status: ongoing, but no worsening Treatments attempted:  Tylenol and stretches + Ibuprofen (which helps) Compliant with recommended treatment: yes Relief with NSAIDs?:  moderate Location:Right and midline Duration:months Severity: 8/10 Quality: varies dull to sharp in intensity Frequency: intermittent Radiation: none Aggravating factors: lifting and movement Alleviating factors: Ibuprofen and Tylenol, sleeping without pillow Weakness:  no Paresthesias / decreased sensation:  no  Fevers:  no   Relevant past medical, surgical, family and social history reviewed and updated as indicated. Interim medical history since our last visit reviewed. Allergies and medications reviewed and updated.  Review of Systems  Constitutional:  Negative for activity change, appetite change, diaphoresis, fatigue and fever.  Respiratory:  Negative for cough, chest tightness and shortness of breath.    Cardiovascular:  Negative for chest pain, palpitations and leg swelling.  Musculoskeletal:  Positive for neck pain.  Neurological: Negative.   Psychiatric/Behavioral: Negative.      Per HPI unless specifically indicated above     Objective:    BP 116/80 (BP Location: Left Arm, Patient Position: Sitting)   Pulse 76   Temp 98.5 F (36.9 C) (Oral)   Ht _1  (1.651 m)   Wt 155 lb 14.4 oz (70.7 kg)   LMP  (LMP Unknown)   SpO2 97%   BMI 25.94 kg/m   Wt Readings from Last 3 Encounters:  06/23/22 155 lb 14.4 oz (70.7 kg)  04/27/22 157 lb 12.8 oz (71.6 kg)  08/25/21 157 lb (71.2 kg)    Physical Exam Vitals and nursing note reviewed.  Constitutional:      General: She is awake. She is not in acute distress.    Appearance: She is well-developed and well-groomed. She is not ill-appearing or toxic-appearing.  HENT:     Head: Normocephalic.     Right Ear: Hearing normal.     Left Ear: Hearing normal.     Nose: Nose normal.  Eyes:     General: Lids are normal.        Right eye: No discharge.        Left eye: No discharge.     Conjunctiva/sclera: Conjunctivae normal.     Pupils: Pupils are equal, round, and reactive to light.  Neck:     Thyroid: No thyromegaly.  Vascular: No carotid bruit.  Cardiovascular:     Rate and Rhythm: Normal rate and regular rhythm.     Heart sounds: Normal heart sounds. No murmur heard.    No gallop.  Pulmonary:     Effort: Pulmonary effort is normal. No accessory muscle usage or respiratory distress.     Breath sounds: Normal breath sounds.  Abdominal:     General: Bowel sounds are normal.     Palpations: Abdomen is soft.  Musculoskeletal:     Cervical back: Normal range of motion and neck supple. No rigidity or torticollis. Pain with movement (flexion and extension) present. No spinous process tenderness or muscular tenderness. Normal range of motion.     Right lower leg: No edema.     Left lower leg: No edema.  Lymphadenopathy:      Cervical: No cervical adenopathy.  Skin:    General: Skin is warm and dry.  Neurological:     Mental Status: She is alert and oriented to person, place, and time.  Psychiatric:        Attention and Perception: Attention normal.        Mood and Affect: Mood normal.        Speech: Speech normal.        Behavior: Behavior normal. Behavior is cooperative.        Thought Content: Thought content normal.     Results for orders placed or performed in visit on 08/25/21  WET PREP FOR [place], YEAST, CLUE   Specimen: Sterile Swab   Sterile Swab  Result Value Ref Range   Trichomonas Exam Negative Negative   Yeast Exam Negative Negative   Clue Cell Exam Negative Negative  [id number] 11+Oxyco+Alc+Crt-Bund  Result Value Ref Range   Ethanol Negative Cutoff=0.020 %   Amphetamines, Urine Negative Cutoff=1000 ng/mL   Barbiturate Negative Cutoff=200 ng/mL   BENZODIAZ UR QL Negative Cutoff=200 ng/mL   Cannabinoid Quant, Ur Negative Cutoff=50 ng/mL   Cocaine (Metabolite) Negative Cutoff=300 ng/mL   OPIATE SCREEN URINE Negative Cutoff=300 ng/mL   Oxycodone/Oxymorphone, Urine Negative Cutoff=300 ng/mL   Phencyclidine Negative Cutoff=25 ng/mL   Methadone Screen, Urine Negative Cutoff=300 ng/mL   Propoxyphene Negative Cutoff=300 ng/mL   Meperidine Negative Cutoff=200 ng/mL   Tramadol Negative Cutoff=200 ng/mL   Creatinine 34.2 20.0 - 300.0 mg/dL   pH, Urine 5.6 4.5 - 8.9  CBC with Differential/Platelet  Result Value Ref Range   WBC 6.3 3.4 - 10.8 x10E3/uL   RBC 4.25 3.77 - 5.28 x10E6/uL   Hemoglobin 13.9 11.1 - 15.9 g/dL   Hematocrit 40.9 34.0 - 46.6 %   MCV 96 79 - 97 fL   MCH 32.7 26.6 - 33.0 pg   MCHC 34.0 31.5 - 35.7 g/dL   RDW 11.7 11.7 - 15.4 %   Platelets 278 150 - 450 x10E3/uL   Neutrophils 44 Not Estab. %   Lymphs 39 Not Estab. %   Monocytes 9 Not Estab. %   Eos 7 Not Estab. %   Basos 1 Not Estab. %   Neutrophils Absolute 2.8 1.4 - 7.0 x10E3/uL   Lymphocytes Absolute 2.5 0.7 - 3.1  x10E3/uL   Monocytes Absolute 0.6 0.1 - 0.9 x10E3/uL   EOS (ABSOLUTE) 0.4 0.0 - 0.4 x10E3/uL   Basophils Absolute 0.1 0.0 - 0.2 x10E3/uL   Immature Granulocytes 0 Not Estab. %   Immature Grans (Abs) 0.0 0.0 - 0.1 x10E3/uL  Comprehensive metabolic panel  Result Value Ref Range   Glucose 77 70 - 99  mg/dL   BUN 14 8 - 27 mg/dL   Creatinine, Ser 0.92 0.57 - 1.00 mg/dL   eGFR 67 >59 mL/min/1.73   BUN/Creatinine Ratio 15 12 - 28   Sodium 138 134 - 144 mmol/L   Potassium 4.4 3.5 - 5.2 mmol/L   Chloride 101 96 - 106 mmol/L   CO2 22 20 - 29 mmol/L   Calcium 9.7 8.7 - 10.3 mg/dL   Total Protein 6.9 6.0 - 8.5 g/dL   Albumin 4.6 3.8 - 4.8 g/dL   Globulin, Total 2.3 1.5 - 4.5 g/dL   Albumin/Globulin Ratio 2.0 1.2 - 2.2   Bilirubin Total 0.6 0.0 - 1.2 mg/dL   Alkaline Phosphatase 63 44 - 121 IU/L   AST 17 0 - 40 IU/L   ALT 20 0 - 32 IU/L  Lipid Panel w/o Chol/HDL Ratio  Result Value Ref Range   Cholesterol, Total 233 (H) 100 - 199 mg/dL   Triglycerides 173 (H) 0 - 149 mg/dL   HDL 73 >39 mg/dL   VLDL Cholesterol Cal 30 5 - 40 mg/dL   LDL Chol Calc (NIH) 130 (H) 0 - 99 mg/dL  TSH  Result Value Ref Range   TSH 1.190 0.450 - 4.500 uIU/mL  VITAMIN D 25 Hydroxy (Vit-D Deficiency, Fractures)  Result Value Ref Range   Vit D, 25-Hydroxy 21.0 (L) 30.0 - 100.0 ng/mL  Hepatitis C antibody  Result Value Ref Range   Hep C Virus Ab <0.1 0.0 - 0.9 s/co ratio      Assessment & Plan:   Problem List Items Addressed This Visit       Musculoskeletal and Integument   Osteopenia   Relevant Orders   DG Bone Density     Other   Neck pain - Primary    Ongoing.  Will obtain imaging as has been present since May 2023.  Suspect some OA present and possible pinched nerve.  Trial Prednisone taper, discussed with patient.  Recommend at home simple treatment, avoid daily Ibuprofen.  Determine next steps after imaging returns.  May need PT or physiatry if ongoing pain.  Return in 2 weeks.      Relevant  Orders   DG Cervical Spine Complete     Follow up plan: Return in about 2 weeks (around 07/07/2022) for Neck Pain.

## 2022-06-23 NOTE — Assessment & Plan Note (Signed)
Ongoing.  Will obtain imaging as has been present since May 2023.  Suspect some OA present and possible pinched nerve.  Trial Prednisone taper, discussed with patient.  Recommend at home simple treatment, avoid daily Ibuprofen.  Determine next steps after imaging returns.  May need PT or physiatry if ongoing pain.  Return in 2 weeks.

## 2022-06-24 NOTE — Progress Notes (Signed)
Contacted via MyChart   Good afternoon Panagiota, your imaging has returned and I am glad we obtained this.  Helps to explain discomfort more and I do suspect it is an impinged nerve based on findings her.  You do have degenerative changes to the neck, arthritic changes down multi levels but most seen mid and lower neck.  You also have narrowing of the area where nerves come out.  I do recommend continue Prednisone taper and if ongoing pain after this I recommend physical therapy as next step.  Any questions? Keep being stellar!!  Thank you for allowing me to participate in your care.  I appreciate you. Kindest regards, Tallulah Hosman

## 2022-06-28 ENCOUNTER — Encounter: Payer: Self-pay | Admitting: Nurse Practitioner

## 2022-07-04 NOTE — Patient Instructions (Addendum)
Start using Voltaren gel -- continue Tylenol and Ibuprofen as needed.  Cervical Radiculopathy  Cervical radiculopathy means that a nerve in the neck (a cervical nerve) is pinched or bruised. This can happen because of an injury to the cervical spine (vertebrae) in the neck, or as a normal part of getting older. This condition can cause pain or loss of feeling (numbness) that runs from your neck all the way down to your arm and fingers. Often, this condition gets better with rest. Treatment may be needed if the condition does not get better. What are the causes? A neck injury. A bulging disk in your spine. Sudden muscle tightening (muscle spasms). Tight muscles in your neck due to overuse. Arthritis. Breakdown in the bones and joints of the spine (spondylosis) due to getting older. Bone spurs that form near the nerves in the neck. What are the signs or symptoms? Pain. The pain may: Run from the neck to the arm and hand. Be very bad or irritating. Get worse when you move your neck. Loss of feeling or tingling in your arm or hand. Weakness in your arm or hand, in very bad cases. How is this treated? In many cases, treatment is not needed for this condition. With rest, the condition often gets better over time. If treatment is needed, options may include: Wearing a soft neck collar (cervical collar) for short periods of time. Doing exercises (physical therapy) to strengthen your neck muscles. Taking medicines. Having shots (injections) in your spine, in very bad cases. Having surgery. This may be needed if other treatments do not help. The type of surgery that is used will depend on the cause of your condition. Follow these instructions at home: If you have a soft neck collar: Wear it as told by your doctor. Take it off only as told by your doctor. Ask your doctor if you can take the collar off for cleaning and bathing. If you are allowed to take the collar off for cleaning or  bathing: Follow instructions from your doctor about how to take off the collar safely. Clean the collar by wiping it with mild soap and water and drying it completely. Take out any removable pads in the collar every 1-2 days. Wash them by hand with soap and water. Let them air-dry completely before you put them back in the collar. Check your skin under the collar for redness or sores. If you see any, tell your doctor. Managing pain     Take over-the-counter and prescription medicines only as told by your doctor. If told, put ice on the painful area. To do this: If you have a soft neck collar, take if off as told by your doctor. Put ice in a plastic bag. Place a towel between your skin and the bag. Leave the ice on for 20 minutes, 2-3 times a day. Take off the ice if your skin turns bright red. This is very important. If you cannot feel pain, heat, or cold, you have a greater risk of damage to the area. If using ice does not help, you can try using heat. Use the heat source that your doctor recommends, such as a moist heat pack or a heating pad. Place a towel between your skin and the heat source. Leave the heat on for 20-30 minutes. Take off the heat if your skin turns bright red. This is very important. If you cannot feel pain, heat, or cold, you have a greater risk of getting burned. You may try  a gentle neck and shoulder rub (massage). Activity Rest as needed. Return to your normal activities when your doctor says that it is safe. Do exercises as told by your doctor or physical therapist. You may have to avoid lifting. Ask your doctor how much you can safely lift. General instructions Use a flat pillow when you sleep. Do not drive while wearing a soft neck collar. If you do not have a soft neck collar, ask your doctor if it is safe to drive while your neck heals. Ask your doctor if you should avoid driving or using machines while you are taking your medicine. Do not smoke or use any  products that contain nicotine or tobacco. If you need help quitting, ask your doctor. Keep all follow-up visits. Contact a doctor if: Your condition does not get better with treatment. Get help right away if: Your pain gets worse and medicine does not help. You lose feeling or feel weak in your hand, arm, face, or leg. You have a high fever. Your neck is stiff. You cannot control when you poop or pee (have incontinence). You have trouble with walking, balance, or talking. Summary Cervical radiculopathy means that a nerve in the neck is pinched or bruised. A nerve can get pinched from a bulging disk, arthritis, an injury to the neck, or other causes. Symptoms include pain, tingling, or loss of feeling that goes from the neck to the arm or hand. Weakness in your arm or hand can happen in very bad cases. Treatment may include resting, wearing a soft neck collar, and doing exercises. You might need to take medicines for pain. In very bad cases, shots or surgery may be needed. This information is not intended to replace advice given to you by your health care provider. Make sure you discuss any questions you have with your health care provider. Document Revised: 05/07/2021 Document Reviewed: 05/07/2021 Elsevier Patient Education  2023 [employer].

## 2022-07-09 ENCOUNTER — Ambulatory Visit (INDEPENDENT_AMBULATORY_CARE_PROVIDER_SITE_OTHER): Payer: Medicare Other | Admitting: Nurse Practitioner

## 2022-07-09 ENCOUNTER — Encounter: Payer: Self-pay | Admitting: Nurse Practitioner

## 2022-07-09 VITALS — BP 109/73 | HR 68 | Temp 98.2°F | Ht 65.0 in | Wt 154.2 lb

## 2022-07-09 DIAGNOSIS — M542 Cervicalgia: Secondary | ICD-10-CM

## 2022-07-09 NOTE — Progress Notes (Signed)
BP 109/73   Pulse 68   Temp 98.2 F (36.8 C) (Oral)   Ht 5' 5"  (1.651 m)   Wt 154 lb 3.2 oz (69.9 kg)   LMP  (LMP Unknown)   SpO2 97%   BMI 25.66 kg/m    Subjective:    Patient ID: Kristen May, female    DOB: 05-16-1951, 71 y.o.   MRN: [id number]  HPI: Kristen May is a 71 y.o. female  Chief Complaint  Patient presents with   Neck Pain    Patient is here for a two week follow up on Neck Pain. Patient says she would like to discuss a lot of questions she has recently found out from imaging results and the next recommended steps.    NECK PAIN FOLLOW UP Pain has been ongoing since May when she had traveled. Has underlying osteopenia. 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  On 06/23/22 provided Prednisone taper which she feels improved symptoms some + rested.   Diagnosis: neck pain Status: ongoing Treatments attempted:  Tylenol and stretches + Ibuprofen  Compliant with recommended treatment: yes Relief with NSAIDs?:  moderate Location: Right and midline Duration: months Severity: 0/10 at present -- worsens as day goes on Quality: varies dull to sharp in intensity Frequency: intermittent, waxes and wanes Radiation: none Aggravating factors: lifting and movement Alleviating factors: Ibuprofen and Tylenol -- sleeping with a flat pillow Weakness:  no Paresthesias / decreased sensation:  no  Fevers:  no   Relevant past medical, surgical, family and social history reviewed and updated as indicated. Interim medical history since our last visit reviewed. Allergies and medications reviewed and updated.  Review of Systems  Constitutional:  Negative for activity change, appetite change, diaphoresis, fatigue and fever.  Respiratory:  Negative for cough, chest tightness and shortness of breath.   Cardiovascular:  Negative for chest pain, palpitations and leg swelling.  Musculoskeletal:  Positive for neck pain.  Neurological: Negative.    Psychiatric/Behavioral: Negative.      Per HPI unless specifically indicated above     Objective:    BP 109/73   Pulse 68   Temp 98.2 F (36.8 C) (Oral)   Ht 5' 5"  (1.651 m)   Wt 154 lb 3.2 oz (69.9 kg)   LMP  (LMP Unknown)   SpO2 97%   BMI 25.66 kg/m   Wt Readings from Last 3 Encounters:  07/09/22 154 lb 3.2 oz (69.9 kg)  06/23/22 155 lb 14.4 oz (70.7 kg)  04/27/22 157 lb 12.8 oz (71.6 kg)    Physical Exam Vitals and nursing note reviewed.  Constitutional:      General: She is awake. She is not in acute distress.    Appearance: She is well-developed and well-groomed. She is not ill-appearing or toxic-appearing.  HENT:     Head: Normocephalic.     Right Ear: Hearing normal.     Left Ear: Hearing normal.     Nose: Nose normal.  Eyes:     General: Lids are normal.        Right eye: No discharge.        Left eye: No discharge.     Conjunctiva/sclera: Conjunctivae normal.     Pupils: Pupils are equal, round, and reactive to light.  Neck:     Thyroid: No thyromegaly.     Vascular: No carotid bruit.  Cardiovascular:     Rate and Rhythm: Normal rate and regular rhythm.  Heart sounds: Normal heart sounds. No murmur heard.    No gallop.  Pulmonary:     Effort: Pulmonary effort is normal. No accessory muscle usage or respiratory distress.     Breath sounds: Normal breath sounds.  Abdominal:     General: Bowel sounds are normal.     Palpations: Abdomen is soft.  Musculoskeletal:     Cervical back: Normal range of motion and neck supple. No rigidity or torticollis. No pain with movement, spinous process tenderness or muscular tenderness. Normal range of motion.     Right lower leg: No edema.     Left lower leg: No edema.  Lymphadenopathy:     Cervical: No cervical adenopathy.  Skin:    General: Skin is warm and dry.  Neurological:     Mental Status: She is alert and oriented to person, place, and time.  Psychiatric:        Attention and Perception: Attention  normal.        Mood and Affect: Mood normal.        Speech: Speech normal.        Behavior: Behavior normal. Behavior is cooperative.        Thought Content: Thought content normal.    Results for orders placed or performed in visit on 08/25/21  WET PREP FOR [place], YEAST, CLUE   Specimen: Sterile Swab   Sterile Swab  Result Value Ref Range   Trichomonas Exam Negative Negative   Yeast Exam Negative Negative   Clue Cell Exam Negative Negative  [id number] 11+Oxyco+Alc+Crt-Bund  Result Value Ref Range   Ethanol Negative Cutoff=0.020 %   Amphetamines, Urine Negative Cutoff=1000 ng/mL   Barbiturate Negative Cutoff=200 ng/mL   BENZODIAZ UR QL Negative Cutoff=200 ng/mL   Cannabinoid Quant, Ur Negative Cutoff=50 ng/mL   Cocaine (Metabolite) Negative Cutoff=300 ng/mL   OPIATE SCREEN URINE Negative Cutoff=300 ng/mL   Oxycodone/Oxymorphone, Urine Negative Cutoff=300 ng/mL   Phencyclidine Negative Cutoff=25 ng/mL   Methadone Screen, Urine Negative Cutoff=300 ng/mL   Propoxyphene Negative Cutoff=300 ng/mL   Meperidine Negative Cutoff=200 ng/mL   Tramadol Negative Cutoff=200 ng/mL   Creatinine 34.2 20.0 - 300.0 mg/dL   pH, Urine 5.6 4.5 - 8.9  CBC with Differential/Platelet  Result Value Ref Range   WBC 6.3 3.4 - 10.8 x10E3/uL   RBC 4.25 3.77 - 5.28 x10E6/uL   Hemoglobin 13.9 11.1 - 15.9 g/dL   Hematocrit 40.9 34.0 - 46.6 %   MCV 96 79 - 97 fL   MCH 32.7 26.6 - 33.0 pg   MCHC 34.0 31.5 - 35.7 g/dL   RDW 11.7 11.7 - 15.4 %   Platelets 278 150 - 450 x10E3/uL   Neutrophils 44 Not Estab. %   Lymphs 39 Not Estab. %   Monocytes 9 Not Estab. %   Eos 7 Not Estab. %   Basos 1 Not Estab. %   Neutrophils Absolute 2.8 1.4 - 7.0 x10E3/uL   Lymphocytes Absolute 2.5 0.7 - 3.1 x10E3/uL   Monocytes Absolute 0.6 0.1 - 0.9 x10E3/uL   EOS (ABSOLUTE) 0.4 0.0 - 0.4 x10E3/uL   Basophils Absolute 0.1 0.0 - 0.2 x10E3/uL   Immature Granulocytes 0 Not Estab. %   Immature Grans (Abs) 0.0 0.0 - 0.1 x10E3/uL   Comprehensive metabolic panel  Result Value Ref Range   Glucose 77 70 - 99 mg/dL   BUN 14 8 - 27 mg/dL   Creatinine, Ser 0.92 0.57 - 1.00 mg/dL   eGFR 67 >59 mL/min/1.73   BUN/Creatinine  Ratio 15 12 - 28   Sodium 138 134 - 144 mmol/L   Potassium 4.4 3.5 - 5.2 mmol/L   Chloride 101 96 - 106 mmol/L   CO2 22 20 - 29 mmol/L   Calcium 9.7 8.7 - 10.3 mg/dL   Total Protein 6.9 6.0 - 8.5 g/dL   Albumin 4.6 3.8 - 4.8 g/dL   Globulin, Total 2.3 1.5 - 4.5 g/dL   Albumin/Globulin Ratio 2.0 1.2 - 2.2   Bilirubin Total 0.6 0.0 - 1.2 mg/dL   Alkaline Phosphatase 63 44 - 121 IU/L   AST 17 0 - 40 IU/L   ALT 20 0 - 32 IU/L  Lipid Panel w/o Chol/HDL Ratio  Result Value Ref Range   Cholesterol, Total 233 (H) 100 - 199 mg/dL   Triglycerides 173 (H) 0 - 149 mg/dL   HDL 73 >39 mg/dL   VLDL Cholesterol Cal 30 5 - 40 mg/dL   LDL Chol Calc (NIH) 130 (H) 0 - 99 mg/dL  TSH  Result Value Ref Range   TSH 1.190 0.450 - 4.500 uIU/mL  VITAMIN D 25 Hydroxy (Vit-D Deficiency, Fractures)  Result Value Ref Range   Vit D, 25-Hydroxy 21.0 (L) 30.0 - 100.0 ng/mL  Hepatitis C antibody  Result Value Ref Range   Hep C Virus Ab <0.1 0.0 - 0.9 s/co ratio      Assessment & Plan:   Problem List Items Addressed This Visit       Other   Neck pain - Primary    Ongoing.  Referral to physical therapy to further work on stretching and strengthening exercises to avoid flares.  Continue at home regimen for pain, add on Voltaren gel as needed + TENS.  Consider MRI or physiatry referral if ongoing pain.      Relevant Orders   Ambulatory referral to Physical Therapy     Follow up plan: Return for return as scheduled 09/08/22.

## 2022-07-09 NOTE — Assessment & Plan Note (Addendum)
Ongoing.  Referral to physical therapy to further work on stretching and strengthening exercises to avoid flares.  Continue at home regimen for pain, add on Voltaren gel as needed + TENS.  Consider MRI or physiatry referral if ongoing pain.

## 2022-07-15 ENCOUNTER — Telehealth: Payer: Self-pay

## 2022-07-15 NOTE — Telephone Encounter (Signed)
Called patient about ordering a mammogram and patient has declined.

## 2022-07-20 ENCOUNTER — Encounter: Payer: Self-pay | Admitting: Nurse Practitioner

## 2022-07-27 ENCOUNTER — Ambulatory Visit
Admission: RE | Admit: 2022-07-27 | Discharge: 2022-07-27 | Disposition: A | Discharge status: Home / Self Care | Payer: Medicare Other | Source: Ambulatory Visit | Attending: Nurse Practitioner | Admitting: Nurse Practitioner

## 2022-07-27 DIAGNOSIS — M8588 Other specified disorders of bone density and structure, other site: Secondary | ICD-10-CM | POA: Insufficient documentation

## 2022-07-27 NOTE — Progress Notes (Signed)
Contacted via MyChart   Your bone density shows thinning bones (osteopenia) but not brittle (osteoporosis). We recommend Vitamin D supplementation of about 2,0000 IUs of over the counter Vitamin D3. In addition, we recommend a diet high in calcium with dairy and dark green leafy vegetables. We would like you to get plenty of weight bearing exercises with walking and resistance training such as light weights or resistance bands available with instructions at places such as [employer].

## 2022-08-05 ENCOUNTER — Ambulatory Visit (INDEPENDENT_AMBULATORY_CARE_PROVIDER_SITE_OTHER): Payer: Medicare Other | Admitting: *Deleted

## 2022-08-05 DIAGNOSIS — Z0000 Encounter for general adult medical examination without abnormal findings: Secondary | ICD-10-CM | POA: Diagnosis not present

## 2022-08-05 NOTE — Progress Notes (Signed)
Subjective:   Kristen May is a 71 y.o. female who presents for Medicare Annual (Subsequent) preventive examination.  I connected with  Daine Croker on 08/05/22 by a telephone enabled telemedicine application and verified that I am speaking with the correct person using two identifiers.   I discussed the limitations of evaluation and management by telemedicine. The patient expressed understanding and agreed to proceed.  Patient location: home  Provider location:  Tele-Health-home    Review of Systems     Cardiac Risk Factors include: advanced age (>55men, >68 women)     Objective:    Today's Vitals   There is no height or weight on file to calculate BMI.     07/29/2021    5:14 PM  Advanced Directives  Does Patient Have a Medical Advance Directive? No  Would patient like information on creating a medical advance directive? No - Patient declined    Current Medications (verified) Outpatient Encounter Medications as of 08/05/2022  Medication Sig   Cholecalciferol 25 MCG (1000 UT) tablet Take 2,000 Units by mouth daily.   GLYCINE PO Take 5 mLs by mouth daily.   LORazepam (ATIVAN) 1 MG tablet Take 1 tablet (1 mg total) by mouth at bedtime as needed for anxiety.   Melatonin 10 MG TABS Take 10 mg by mouth at bedtime. (Patient not taking: Reported on 08/05/2022)   No facility-administered encounter medications on file as of 08/05/2022.    Allergies (verified) Morphine and related   History: Past Medical History:  Diagnosis Date   Anxiety    Back pain    Cervicalgia    Depression    Elevated vitamin B12 level    Insomnia    Lumbago    Osteopenia    Past Surgical History:  Procedure Laterality Date   CHOLECYSTECTOMY     CHOLESTEATOMA EXCISION     eye surgerry     SKIN GRAFT     Family History  Problem Relation Age of Onset   Cancer Mother        stomach   COPD Mother    Heart disease Father    Cancer Father        colon   Diabetes Father    Polycystic ovary  syndrome Daughter    Cancer Paternal Grandfather        rectal   Depression Daughter    Bipolar disorder Daughter    Social History   Socioeconomic History   Marital status: Widowed    Spouse name: Not on file   Number of children: Not on file   Years of education: Not on file   Highest education level: Not on file  Occupational History   Not on file  Tobacco Use   Smoking status: Former   Smokeless tobacco: Never  Vaping Use   Vaping Use: Never used  Substance and Sexual Activity   Alcohol use: Not Currently    Alcohol/week: 0.0 standard drinks of alcohol    Comment: on occasion   Drug use: No   Sexual activity: Yes  Other Topics Concern   Not on file  Social History Narrative   Not on file   Social Determinants of Health   Financial Resource Strain: Low Risk  (08/05/2022)   Overall Financial Resource Strain (CARDIA)    Difficulty of Paying Living Expenses: Not hard at all  Food Insecurity: No Food Insecurity (08/05/2022)   Hunger Vital Sign    Worried About Running Out of Food in the Last Year:  Never true    Ran Out of Food in the Last Year: Never true  Transportation Needs: No Transportation Needs (08/05/2022)   PRAPARE - Hydrologist (Medical): No    Lack of Transportation (Non-Medical): No  Physical Activity: Inactive (08/05/2022)   Exercise Vital Sign    Days of Exercise per Week: 0 days    Minutes of Exercise per Session: 0 min  Stress: No Stress Concern Present (08/05/2022)   [place]    Feeling of Stress : Not at all  Social Connections: Unknown (08/05/2022)   Social Connection and Isolation Panel [NHANES]    Frequency of Communication with Friends and Family: Once a week    Frequency of Social Gatherings with Friends and Family: Three times a week    Attends Religious Services: Never    Active Member of Clubs or Organizations: Yes    Attends Programme researcher, broadcasting/film/video: More than 4 times per year    Marital Status: Not on file    Tobacco Counseling Counseling given: Not Answered   Clinical Intake:  Pre-visit preparation completed: Yes  Pain : No/denies pain     Diabetes: No  How often do you need to have someone help you when you read instructions, pamphlets, or other written materials from your doctor or pharmacy?: 1 - Never  Diabetic?  no  Interpreter Needed?: No  Information entered by :: Leroy Kennedy LPN   Activities of Daily Living    08/05/2022   11:52 AM 08/25/2021   12:12 PM  In your present state of health, do you have any difficulty performing the following activities:  Hearing? 1 0  Vision? 0 0  Difficulty concentrating or making decisions? 0 0  Walking or climbing stairs? 0 0  Dressing or bathing? 0 0  Doing errands, shopping? 0 0  Preparing Food and eating ? N   Using the Toilet? N   In the past six months, have you accidently leaked urine? N   Do you have problems with loss of bowel control? N   Managing your Medications? N   Managing your Finances? N   Housekeeping or managing your Housekeeping? N     Patient Care Team: Venita Lick, NP as PCP - General (Nurse Practitioner)  Indicate any recent Medical Services you may have received from other than [facility] providers in the past year (date may be approximate).     Assessment:   This is a routine wellness examination for [facility].  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 Vision Screening - Comments:: Cataract surgery 2023 [facility] Up to date  Dietary issues and exercise activities discussed: Current Exercise Habits: The patient does not participate in regular exercise at present   Goals Addressed             This Visit's Progress    Patient Stated       No goals       Depression Screen    08/05/2022   11:44 AM 08/05/2022   11:43 AM 07/09/2022   11:15 AM  04/27/2022    3:09 PM 08/25/2021   11:26 AM 07/29/2021    5:10 PM 12/23/2020    2:10 PM  PHQ 2/9 Scores  PHQ - 2 Score 0 0 0 0 0 0 0  PHQ- 9 Score 0 0 0 0 0      Fall Risk  08/05/2022   11:38 AM 07/09/2022   11:15 AM 04/27/2022    3:09 PM 08/25/2021   12:12 PM 07/29/2021    5:15 PM  Fall Risk   Falls in the past year? 0 0 0 0 0  Number falls in past yr: 0 0 0 0 0  Injury with Fall? 0 0 0 0 0  Risk for fall due to :  No Fall Risks No Fall Risks No Fall Risks No Fall Risks  Follow up Falls evaluation completed;Education provided;Falls prevention discussed Falls evaluation completed Falls evaluation completed Education provided Falls evaluation completed    FALL RISK PREVENTION PERTAINING TO THE HOME:  Any stairs in or around the home? Yes  If so, are there any without handrails? No  Home free of loose throw rugs in walkways, pet beds, electrical cords, etc? Yes  Adequate lighting in your home to reduce risk of falls? Yes   ASSISTIVE DEVICES UTILIZED TO PREVENT FALLS:  Life alert? No  Use of a cane, walker or w/c? No  Grab bars in the bathroom? No  Shower chair or bench in shower? Yes  Elevated toilet seat or a handicapped toilet? No   TIMED UP AND GO:  Was the test performed? No .    Cognitive Function:        08/05/2022   11:37 AM 07/29/2021    5:16 PM  6CIT Screen  What Year? 0 points 0 points  What month? 0 points 0 points  What time? 0 points 0 points  Count back from 20 0 points 0 points  Months in reverse 0 points 0 points  Repeat phrase 0 points 0 points  Total Score 0 points 0 points    Immunizations Immunization History  Administered Date(s) Administered   Fluad Quad(high Dose 65+) 08/24/2017, 10/25/2018, 08/02/2019, 08/12/2020, 08/25/2021   Influenza, High Dose Seasonal PF 08/24/2017, 10/25/2018, 07/02/2022   Influenza,inj,Quad PF,6+ Mos 11/27/2015, 08/23/2016   PFIZER(Purple Top)SARS-COV-2 Vaccination 12/10/2019, 01/01/2020, 08/12/2020, 02/18/2021    Pfizer Covid-19 Vaccine Bivalent Booster 76yrs & up 08/27/2021   Pneumococcal Conjugate-13 08/24/2017   Pneumococcal Polysaccharide-23 03/07/2020   Pneumococcal-Unspecified 07/29/2014   Td 02/21/2007   Tdap 08/24/2017    TDAP status: Up to date  Flu Vaccine status: Up to date  Pneumococcal vaccine status: Up to date  Covid-19 vaccine status: Information provided on how to obtain vaccines.   Qualifies for Shingles Vaccine? Yes   Zostavax completed No   Shingrix Completed?: No.    Education has been provided regarding the importance of this vaccine. Patient has been advised to call insurance company to determine out of pocket expense if they have not yet received this vaccine. Advised may also receive vaccine at local pharmacy or Health Dept. Verbalized acceptance and understanding.  Screening Tests Health Maintenance  Topic Date Due   Zoster Vaccines- Shingrix (1 of 2) Never done   COVID-19 Vaccine (6 - Pfizer risk series) 10/22/2021   MAMMOGRAM  04/28/2023 (Originally 10/06/2015)   COLONOSCOPY (Pts 45-30yrs Insurance coverage will need to be confirmed)  03/31/2027   DEXA SCAN  07/28/2027   TETANUS/TDAP  08/25/2027   Pneumonia Vaccine 51+ Years old  Completed   INFLUENZA VACCINE  Completed   Hepatitis C Screening  Completed   HPV VACCINES  Aged Out    Health Maintenance  Health Maintenance Due  Topic Date Due   Zoster Vaccines- Shingrix (1 of 2) Never done   COVID-19 Vaccine (6 - Pfizer risk series) 10/22/2021  Colorectal cancer screening: Type of screening: Colonoscopy. Completed 2023. Repeat every 5 years  Mammogram  declined  Bone Density status: Completed 2023. Results reflect: Bone density results: OSTEOPENIA. Repeat every 5 years.  Lung Cancer Screening: (Low Dose CT Chest recommended if Age 40-80 years, 30 pack-year currently smoking OR have quit w/in 15years.) does not qualify.   Lung Cancer Screening Referral:   Additional Screening:  Hepatitis C  Screening: does not qualify; Completed 2022  Vision Screening: Recommended annual ophthalmology exams for early detection of glaucoma and other disorders of the eye. Is the patient up to date with their annual eye exam?  Yes  Who is the provider or what is the name of the office in which the patient attends annual eye exams? [facility] If pt is not established with a provider, would they like to be referred to a provider to establish care? No .   Dental Screening: Recommended annual dental exams for proper oral hygiene  Community Resource Referral / Chronic Care Management: CRR required this visit?  No   CCM required this visit?  No      Plan:     I have personally reviewed and noted the following in the patient's chart:   Medical and social history Use of alcohol, tobacco or illicit drugs  Current medications and supplements including opioid prescriptions. Patient is not currently taking opioid prescriptions. Functional ability and status Nutritional status Physical activity Advanced directives List of other physicians Hospitalizations, surgeries, and ER visits in previous 12 months Vitals Screenings to include cognitive, depression, and falls Referrals and appointments  In addition, I have reviewed and discussed with patient certain preventive protocols, quality metrics, and best practice recommendations. A written personalized care plan for preventive services as well as general preventive health recommendations were provided to patient.     Remi HaggardJulie Brittney Mucha, LPN   5/28/41329/21/2023   Nurse Notes:

## 2022-08-05 NOTE — Patient Instructions (Signed)
Kristen May , Thank you for taking time to come for your Medicare Wellness Visit. I appreciate your ongoing commitment to your health goals. Please review the following plan we discussed and let me know if I can assist you in the future.   Screening recommendations/referrals: Colonoscopy: up to date Mammogram: Education provided Bone Density: up to date Recommended yearly ophthalmology/optometry visit for glaucoma screening and checkup Recommended yearly dental visit for hygiene and checkup  Vaccinations: Influenza vaccine: up to date Pneumococcal vaccine: up to date Tdap vaccine: up to date Shingles vaccine: Education provided    Advanced directives: Education provided   Next appointment: 09-08-2022 @ 1:00  [place]   Preventive Care 4 Years and Older, Female Preventive care refers to lifestyle choices and visits with your health care provider that can promote health and wellness. What does preventive care include? A yearly physical exam. This is also called an annual well check. Dental exams once or twice a year. Routine eye exams. Ask your health care provider how often you should have your eyes checked. Personal lifestyle choices, including: Daily care of your teeth and gums. Regular physical activity. Eating a healthy diet. Avoiding tobacco and drug use. Limiting alcohol use. Practicing safe sex. Taking low-dose aspirin every day. Taking vitamin and mineral supplements as recommended by your health care provider. What happens during an annual well check? The services and screenings done by your health care provider during your annual well check will depend on your age, overall health, lifestyle risk factors, and family history of disease. Counseling  Your health care provider may ask you questions about your: Alcohol use. Tobacco use. Drug use. Emotional well-being. Home and relationship well-being. Sexual activity. Eating habits. History of falls. Memory and ability  to understand (cognition). Work and work Statistician. Reproductive health. Screening  You may have the following tests or measurements: Height, weight, and BMI. Blood pressure. Lipid and cholesterol levels. These may be checked every 5 years, or more frequently if you are over 72 years old. Skin check. Lung cancer screening. You may have this screening every year starting at age 43 if you have a 30-pack-year history of smoking and currently smoke or have quit within the past 15 years. Fecal occult blood test (FOBT) of the stool. You may have this test every year starting at age 33. Flexible sigmoidoscopy or colonoscopy. You may have a sigmoidoscopy every 5 years or a colonoscopy every 10 years starting at age 3. Hepatitis C blood test. Hepatitis B blood test. Sexually transmitted disease (STD) testing. Diabetes screening. This is done by checking your blood sugar (glucose) after you have not eaten for a while (fasting). You may have this done every 1-3 years. Bone density scan. This is done to screen for osteoporosis. You may have this done starting at age 51. Mammogram. This may be done every 1-2 years. Talk to your health care provider about how often you should have regular mammograms. Talk with your health care provider about your test results, treatment options, and if necessary, the need for more tests. Vaccines  Your health care provider may recommend certain vaccines, such as: Influenza vaccine. This is recommended every year. Tetanus, diphtheria, and acellular pertussis (Tdap, Td) vaccine. You may need a Td booster every 10 years. Zoster vaccine. You may need this after age 79. Pneumococcal 13-valent conjugate (PCV13) vaccine. One dose is recommended after age 74. Pneumococcal polysaccharide (PPSV23) vaccine. One dose is recommended after age 49. Talk to your health care provider about which screenings  and vaccines you need and how often you need them. This information is not  intended to replace advice given to you by your health care provider. Make sure you discuss any questions you have with your health care provider. Document Released: 11/28/2015 Document Revised: 07/21/2016 Document Reviewed: 09/02/2015 Elsevier Interactive Patient Education  2017 [employer] Prevention in the Home Falls can cause injuries. They can happen to people of all ages. There are many things you can do to make your home safe and to help prevent falls. What can I do on the outside of my home? Regularly fix the edges of walkways and driveways and fix any cracks. Remove anything that might make you trip as you walk through a door, such as a raised step or threshold. Trim any bushes or trees on the path to your home. Use bright outdoor lighting. Clear any walking paths of anything that might make someone trip, such as rocks or tools. Regularly check to see if handrails are loose or broken. Make sure that both sides of any steps have handrails. Any raised decks and porches should have guardrails on the edges. Have any leaves, snow, or ice cleared regularly. Use sand or salt on walking paths during winter. Clean up any spills in your garage right away. This includes oil or grease spills. What can I do in the bathroom? Use night lights. Install grab bars by the toilet and in the tub and shower. Do not use towel bars as grab bars. Use non-skid mats or decals in the tub or shower. If you need to sit down in the shower, use a plastic, non-slip stool. Keep the floor dry. Clean up any water that spills on the floor as soon as it happens. Remove soap buildup in the tub or shower regularly. Attach bath mats securely with double-sided non-slip rug tape. Do not have throw rugs and other things on the floor that can make you trip. What can I do in the bedroom? Use night lights. Make sure that you have a light by your bed that is easy to reach. Do not use any sheets or blankets that are  too big for your bed. They should not hang down onto the floor. Have a firm chair that has side arms. You can use this for support while you get dressed. Do not have throw rugs and other things on the floor that can make you trip. What can I do in the kitchen? Clean up any spills right away. Avoid walking on wet floors. Keep items that you use a lot in easy-to-reach places. If you need to reach something above you, use a strong step stool that has a grab bar. Keep electrical cords out of the way. Do not use floor polish or wax that makes floors slippery. If you must use wax, use non-skid floor wax. Do not have throw rugs and other things on the floor that can make you trip. What can I do with my stairs? Do not leave any items on the stairs. Make sure that there are handrails on both sides of the stairs and use them. Fix handrails that are broken or loose. Make sure that handrails are as long as the stairways. Check any carpeting to make sure that it is firmly attached to the stairs. Fix any carpet that is loose or worn. Avoid having throw rugs at the top or bottom of the stairs. If you do have throw rugs, attach them to the floor with carpet tape.  Make sure that you have a light switch at the top of the stairs and the bottom of the stairs. If you do not have them, ask someone to add them for you. What else can I do to help prevent falls? Wear shoes that: Do not have high heels. Have rubber bottoms. Are comfortable and fit you well. Are closed at the toe. Do not wear sandals. If you use a stepladder: Make sure that it is fully opened. Do not climb a closed stepladder. Make sure that both sides of the stepladder are locked into place. Ask someone to hold it for you, if possible. Clearly mark and make sure that you can see: Any grab bars or handrails. First and last steps. Where the edge of each step is. Use tools that help you move around (mobility aids) if they are needed. These  include: Canes. Walkers. Scooters. Crutches. Turn on the lights when you go into a dark area. Replace any light bulbs as soon as they burn out. Set up your furniture so you have a clear path. Avoid moving your furniture around. If any of your floors are uneven, fix them. If there are any pets around you, be aware of where they are. Review your medicines with your doctor. Some medicines can make you feel dizzy. This can increase your chance of falling. Ask your doctor what other things that you can do to help prevent falls. This information is not intended to replace advice given to you by your health care provider. Make sure you discuss any questions you have with your health care provider. Document Released: 08/28/2009 Document Revised: 04/08/2016 Document Reviewed: 12/06/2014 Elsevier Interactive Patient Education  2017 [employer].

## 2022-09-05 DIAGNOSIS — Z79899 Other long term (current) drug therapy: Secondary | ICD-10-CM | POA: Insufficient documentation

## 2022-09-05 NOTE — Patient Instructions (Signed)

## 2022-09-08 ENCOUNTER — Ambulatory Visit (INDEPENDENT_AMBULATORY_CARE_PROVIDER_SITE_OTHER): Payer: Medicare Other | Admitting: Nurse Practitioner

## 2022-09-08 ENCOUNTER — Encounter: Payer: Self-pay | Admitting: Nurse Practitioner

## 2022-09-08 VITALS — BP 112/73 | HR 80 | Temp 98.2°F | Ht 65.25 in | Wt 162.9 lb

## 2022-09-08 DIAGNOSIS — Z0000 Encounter for general adult medical examination without abnormal findings: Secondary | ICD-10-CM

## 2022-09-08 DIAGNOSIS — F419 Anxiety disorder, unspecified: Secondary | ICD-10-CM

## 2022-09-08 DIAGNOSIS — D649 Anemia, unspecified: Secondary | ICD-10-CM

## 2022-09-08 DIAGNOSIS — F5101 Primary insomnia: Secondary | ICD-10-CM

## 2022-09-08 DIAGNOSIS — E559 Vitamin D deficiency, unspecified: Secondary | ICD-10-CM | POA: Diagnosis not present

## 2022-09-08 DIAGNOSIS — M85852 Other specified disorders of bone density and structure, left thigh: Secondary | ICD-10-CM

## 2022-09-08 DIAGNOSIS — E782 Mixed hyperlipidemia: Secondary | ICD-10-CM

## 2022-09-08 DIAGNOSIS — Z79899 Other long term (current) drug therapy: Secondary | ICD-10-CM

## 2022-09-08 NOTE — Assessment & Plan Note (Signed)
Chronic, stable with minimal use of Ativan.  #30 pills lasts 6 + months most often on PDMP review and discussion with patient.  She is aware of risks of benzo use chronically.  UDS due today, obtain contract next visit.  Denies SI/HI.

## 2022-09-08 NOTE — Assessment & Plan Note (Signed)
Chronic, ongoing.  Recommend continue taking Vitamin D 2000 units at home and continue daily calcium.  Check Vit D and CMP today.

## 2022-09-08 NOTE — Assessment & Plan Note (Signed)
Chronic, ongoing.  Recommend Vitamin D3 2000 units daily, check level today.

## 2022-09-08 NOTE — Progress Notes (Signed)
BP 112/73   Pulse 80   Temp 98.2 F (36.8 C) (Oral)   Ht 5' 5.25" (1.657 m)   Wt 162 lb 14.4 oz (73.9 kg)   LMP  (LMP Unknown)   SpO2 97%   BMI 26.90 kg/m    Subjective:    Patient ID: Kristen May, female    DOB: 07/24/1951, 71 y.o.   MRN: [id number]  HPI: Kristen May is a 71 y.o. female presenting on 09/08/2022 for medical examination. Current medical complaints include:none  She currently lives with: husband Menopausal Symptoms: no   OSTEOPENIA Noted on past DEXA -= 10/29/2013 and repeat DEXA on 07/27/22 noted ongoing osteopenia T-score -1.5.   Adequate calcium & vitamin D: yes  HYPERLIPIDEMIA Continues diet focus.   Hyperlipidemia status: good compliance Satisfied with current treatment?  yes Side effects:  no Medication compliance: good compliance Supplements: none Aspirin:  no The 10-year ASCVD risk score (Arnett DK, et al., 2019) is: 7.3%   Values used to calculate the score:     Age: 32 years     Sex: Female     Is Non-Hispanic African American: No     Diabetic: No     Tobacco smoker: No     Systolic Blood Pressure: 112 mmHg     Is BP treated: No     HDL Cholesterol: 73 mg/dL     Total Cholesterol: 233 mg/dL Chest pain:  no Coronary artery disease:  no Family history CAD:  yes Family history early CAD:  no  DEPRESSION Takes Ativan as needed, has been on for long period -- takes only 1/2 a pill most often.  Helps with performing onstage.   She rarely takes this, gets refills once a year #30 pills.  Pt is aware of risks of benzo medication use to include increased sedation, respiratory suppression, falls, dependence and cardiovascular events. Pt would like to continue treatment as benefit determined to outweigh risk.  Last filled 06/23/22. Mood status: stable Satisfied with current treatment?: yes Symptom severity: mild  Duration of current treatment : chronic Side effects: no Medication compliance: good compliance Psychotherapy/counseling: none Depressed  mood: no Anxious mood: no Anhedonia: no Significant weight loss or gain: no Insomnia: no Fatigue: no Feelings of worthlessness or guilt: no Impaired concentration/indecisiveness: no Suicidal ideations: no Hopelessness: no Crying spells: no    09/08/2022    1:18 PM 08/05/2022   11:44 AM 08/05/2022   11:43 AM 07/09/2022   11:15 AM 04/27/2022    3:09 PM  Depression screen PHQ 2/9  Decreased Interest 0 0 0 0 0  Down, Depressed, Hopeless 0 0 0 0 0  PHQ - 2 Score 0 0 0 0 0  Altered sleeping 0 0 0 0 0  Tired, decreased energy 0 0 0 0 0  Change in appetite 0 0 0 0 0  Feeling bad or failure about yourself  0 0 0 0 0  Trouble concentrating 0 0 0 0 0  Moving slowly or fidgety/restless 0 0 0 0 0  Suicidal thoughts 0 0 0 0 0  PHQ-9 Score 0 0 0 0 0  Difficult doing work/chores  Not difficult at all Not difficult at all Not difficult at all Not difficult at all      09/08/2022    1:18 PM 07/09/2022   11:15 AM 04/27/2022    3:10 PM 08/25/2021   11:26 AM  GAD 7 : Generalized Anxiety Score  Nervous, Anxious, on Edge 0 0 0 1  Control/stop worrying 0 0 0 1  Worry too much - different things 0 0 0 0  Trouble relaxing 0 0 0 0  Restless 0 0 0 0  Easily annoyed or irritable 0 0 0 0  Afraid - awful might happen 0 0 0 0  Total GAD 7 Score 0 0 0 2  Anxiety Difficulty Not difficult at all Not difficult at all Not difficult at all Not difficult at all       09/08/2022    1:24 PM 09/08/2022    1:17 PM 08/05/2022   11:38 AM 07/09/2022   11:15 AM 04/27/2022    3:09 PM  [place] in the past year? 0 0 0 0 0  Number falls in past yr: 0 0 0 0 0  Injury with Fall? 0 0 0 0 0  Risk for fall due to : No Fall Risks No Fall Risks  No Fall Risks No Fall Risks  Follow up Education provided Falls evaluation completed Falls evaluation completed;Education provided;Falls prevention discussed Falls evaluation completed Falls evaluation completed    Functional Status Survey: Is the patient deaf or  have difficulty hearing?: No Does the patient have difficulty seeing, even when wearing glasses/contacts?: No Does the patient have difficulty concentrating, remembering, or making decisions?: No Does the patient have difficulty walking or climbing stairs?: No Does the patient have difficulty dressing or bathing?: No Does the patient have difficulty doing errands alone such as visiting a doctor's office or shopping?: No   Past Medical History:  Past Medical History:  Diagnosis Date   Anxiety    Back pain    Cervicalgia    Depression    Elevated vitamin B12 level    Insomnia    Lumbago    Osteopenia     Surgical History:  Past Surgical History:  Procedure Laterality Date   CHOLECYSTECTOMY     CHOLESTEATOMA EXCISION     eye surgerry     SKIN GRAFT      Medications:  Current Outpatient Medications on File Prior to Visit  Medication Sig   Cholecalciferol 25 MCG (1000 UT) tablet Take 2,000 Units by mouth daily.   GLYCINE PO Take 5 mLs by mouth daily.   LORazepam (ATIVAN) 1 MG tablet Take 1 tablet (1 mg total) by mouth at bedtime as needed for anxiety.   No current facility-administered medications on file prior to visit.    Allergies:  Allergies  Allergen Reactions   Morphine And Related Itching    Social History:  Social History   Socioeconomic History   Marital status: Widowed    Spouse name: Not on file   Number of children: Not on file   Years of education: Not on file   Highest education level: Not on file  Occupational History   Not on file  Tobacco Use   Smoking status: Former   Smokeless tobacco: Never  Vaping Use   Vaping Use: Never used  Substance and Sexual Activity   Alcohol use: Not Currently    Alcohol/week: 0.0 standard drinks of alcohol    Comment: on occasion   Drug use: No   Sexual activity: Yes  Other Topics Concern   Not on file  Social History Narrative   Not on file   Social Determinants of Health   Financial Resource Strain:  Low Risk  (08/05/2022)   Overall Financial Resource Strain (CARDIA)    Difficulty of Paying Living Expenses: Not hard at all  Food Insecurity: No Food Insecurity (08/05/2022)   Hunger Vital Sign    Worried About Running Out of Food in the Last Year: Never true    Ran Out of Food in the Last Year: Never true  Transportation Needs: No Transportation Needs (08/05/2022)   PRAPARE - Hydrologist (Medical): No    Lack of Transportation (Non-Medical): No  Physical Activity: Inactive (08/05/2022)   Exercise Vital Sign    Days of Exercise per Week: 0 days    Minutes of Exercise per Session: 0 min  Stress: No Stress Concern Present (08/05/2022)   [place]    Feeling of Stress : Not at all  Social Connections: Unknown (08/05/2022)   Social Connection and Isolation Panel [NHANES]    Frequency of Communication with Friends and Family: Once a week    Frequency of Social Gatherings with Friends and Family: Three times a week    Attends Religious Services: Never    Active Member of Clubs or Organizations: Yes    Attends Archivist Meetings: More than 4 times per year    Marital Status: Not on file  Intimate Partner Violence: Not At Risk (08/05/2022)   Humiliation, Afraid, Rape, and Kick questionnaire    Fear of Current or Ex-Partner: No    Emotionally Abused: No    Physically Abused: No    Sexually Abused: No   Social History   Tobacco Use  Smoking Status Former  Smokeless Tobacco Never   Social History   Substance and Sexual Activity  Alcohol Use Not Currently   Alcohol/week: 0.0 standard drinks of alcohol   Comment: on occasion    Family History:  Family History  Problem Relation Age of Onset   Cancer Mother        stomach   COPD Mother    Heart disease Father    Cancer Father        colon   Diabetes Father    Polycystic ovary syndrome Daughter    Cancer Paternal Grandfather         rectal   Depression Daughter    Bipolar disorder Daughter     Past medical history, surgical history, medications, allergies, family history and social history reviewed with patient today and changes made to appropriate areas of the chart.   Review of Systems - negative All other ROS negative except what is listed above and in the HPI.      Objective:    BP 112/73   Pulse 80   Temp 98.2 F (36.8 C) (Oral)   Ht 5' 5.25" (1.657 m)   Wt 162 lb 14.4 oz (73.9 kg)   LMP  (LMP Unknown)   SpO2 97%   BMI 26.90 kg/m   Wt Readings from Last 3 Encounters:  09/08/22 162 lb 14.4 oz (73.9 kg)  07/09/22 154 lb 3.2 oz (69.9 kg)  06/23/22 155 lb 14.4 oz (70.7 kg)    Physical Exam Vitals and nursing note reviewed. Exam conducted with a chaperone present.  Constitutional:      General: She is awake. She is not in acute distress.    Appearance: She is well-developed and well-groomed. She is not ill-appearing or toxic-appearing.  HENT:     Head: Normocephalic and atraumatic.     Right Ear: Hearing, tympanic membrane, ear canal and external ear normal. No drainage.     Left Ear: Hearing, tympanic membrane, ear canal and external  ear normal. No drainage.     Nose: Nose normal.     Right Sinus: No maxillary sinus tenderness or frontal sinus tenderness.     Left Sinus: No maxillary sinus tenderness or frontal sinus tenderness.     Mouth/Throat:     Mouth: Mucous membranes are moist.     Pharynx: Oropharynx is clear. Uvula midline. No pharyngeal swelling, oropharyngeal exudate or posterior oropharyngeal erythema.  Eyes:     General: Lids are normal.        Right eye: No discharge.        Left eye: No discharge.     Extraocular Movements: Extraocular movements intact.     Conjunctiva/sclera: Conjunctivae normal.     Pupils: Pupils are equal, round, and reactive to light.     Visual Fields: Right eye visual fields normal and left eye visual fields normal.  Neck:     Thyroid: No  thyromegaly.     Vascular: No carotid bruit.     Trachea: Trachea normal.  Cardiovascular:     Rate and Rhythm: Normal rate and regular rhythm.     Heart sounds: Normal heart sounds. No murmur heard.    No gallop.  Pulmonary:     Effort: Pulmonary effort is normal. No accessory muscle usage or respiratory distress.     Breath sounds: Normal breath sounds.  Chest:  Breasts:    Right: Normal.     Left: Normal.  Abdominal:     General: Bowel sounds are normal.     Palpations: Abdomen is soft. There is no hepatomegaly or splenomegaly.     Tenderness: There is no abdominal tenderness.  Musculoskeletal:        General: Normal range of motion.     Cervical back: Normal range of motion and neck supple.     Right lower leg: No edema.     Left lower leg: No edema.  Lymphadenopathy:     Head:     Right side of head: No submental, submandibular, tonsillar, preauricular or posterior auricular adenopathy.     Left side of head: No submental, submandibular, tonsillar, preauricular or posterior auricular adenopathy.     Cervical: No cervical adenopathy.     Upper Body:     Right upper body: No supraclavicular, axillary or pectoral adenopathy.     Left upper body: No supraclavicular, axillary or pectoral adenopathy.  Skin:    General: Skin is warm and dry.     Capillary Refill: Capillary refill takes less than 2 seconds.     Findings: No rash.  Neurological:     Mental Status: She is alert and oriented to person, place, and time.     Gait: Gait is intact.     Deep Tendon Reflexes: Reflexes are normal and symmetric.     Reflex Scores:      Brachioradialis reflexes are 2+ on the right side and 2+ on the left side.      Patellar reflexes are 2+ on the right side and 2+ on the left side. Psychiatric:        Attention and Perception: Attention normal.        Mood and Affect: Mood normal.        Speech: Speech normal.        Behavior: Behavior normal. Behavior is cooperative.        Thought  Content: Thought content normal.        Judgment: Judgment normal.    Results for orders placed  or performed in visit on 07/20/22  HM COLONOSCOPY  Result Value Ref Range   HM Colonoscopy See Report (in chart) See Report (in chart), Patient Reported      Assessment & Plan:   Problem List Items Addressed This Visit       Musculoskeletal and Integument   Osteopenia of neck of left femur    Chronic, ongoing.  Recommend continue taking Vitamin D 2000 units at home and continue daily calcium.  Check Vit D and CMP today.      Relevant Orders   VITAMIN D 25 Hydroxy (Vit-D Deficiency, Fractures)     Other   Anxiety    Chronic, stable with minimal use of Ativan.  #30 pills lasts 6 + months most often on PDMP review and discussion with patient.  She is aware of risks of benzo use chronically.  UDS due today, obtain contract next visit.  Denies SI/HI.      Relevant Orders   CBC with Differential/Platelet   TSH   [id number] 11+Oxyco+Alc+Crt-Bund   Hyperlipidemia - Primary    Currently diet controlled with recent ASCVD 7.3%.  Continue diet focus and exercise -- recheck lipid panel today.      Relevant Orders   Comprehensive metabolic panel   Lipid Panel w/o Chol/HDL Ratio   Long-term current use of benzodiazepine    Reviewed anxiety plan of care.      Relevant Orders   [id number] 11+Oxyco+Alc+Crt-Bund   Vitamin D deficiency    Chronic, ongoing.  Recommend Vitamin D3 2000 units daily, check level today.      Other Visit Diagnoses     Low hemoglobin       Check CBC today   Relevant Orders   CBC with Differential/Platelet   High risk medication use       UDS on labs today, long term benzo use.   Relevant Orders   [id number] 11+Oxyco+Alc+Crt-Bund   Healthy adult on routine physical examination       Annual physical today with labs and health maintenance reviewed, discussed with patient.        Follow up plan: Return in about 6 months (around 03/10/2023) for ANXIETY,  HLD.   LABORATORY TESTING:  - Pap smear: not applicable  IMMUNIZATIONS:   - Tdap: Tetanus vaccination status reviewed: last tetanus booster within 10 years. - Influenza: Up to date - Pneumovax: Up to date - Prevnar: Up to date - HPV: Not applicable - Zostavax vaccine: will get scheduled at pharmacy  SCREENING: -Mammogram: She wishes not to continue, has asked to discontinue these - Colonoscopy: Up to date  - Bone Density: Up to date  -Hearing Test: Not applicable  -Spirometry: Not applicable   PATIENT COUNSELING:   Advised to take 1 mg of folate supplement per day if capable of pregnancy.   Sexuality: Discussed sexually transmitted diseases, partner selection, use of condoms, avoidance of unintended pregnancy  and contraceptive alternatives.   Advised to avoid cigarette smoking.  I discussed with the patient that most people either abstain from alcohol or drink within safe limits (<=14/week and <=4 drinks/occasion for males, <=7/weeks and <= 3 drinks/occasion for females) and that the risk for alcohol disorders and other health effects rises proportionally with the number of drinks per week and how often a drinker exceeds daily limits.  Discussed cessation/primary prevention of drug use and availability of treatment for abuse.   Diet: Encouraged to adjust caloric intake to maintain  or achieve ideal body weight, to reduce intake  of dietary saturated fat and total fat, to limit sodium intake by avoiding high sodium foods and not adding table salt, and to maintain adequate dietary potassium and calcium preferably from fresh fruits, vegetables, and low-fat dairy products.    Stressed the importance of regular exercise  Injury prevention: Discussed safety belts, safety helmets, smoke detector, smoking near bedding or upholstery.   Dental health: Discussed importance of regular tooth brushing, flossing, and dental visits.    NEXT PREVENTATIVE PHYSICAL DUE IN 1 YEAR. Return in  about 6 months (around 03/10/2023) for ANXIETY, HLD.

## 2022-09-08 NOTE — Assessment & Plan Note (Signed)
Reviewed anxiety plan of care.

## 2022-09-08 NOTE — Assessment & Plan Note (Signed)
Currently diet controlled with recent ASCVD 7.3%.  Continue diet focus and exercise -- recheck lipid panel today.

## 2022-09-09 LAB — COMPREHENSIVE METABOLIC PANEL
ALT: 25 IU/L (ref 0–32)
AST: 22 IU/L (ref 0–40)
Albumin/Globulin Ratio: 2 (ref 1.2–2.2)
Albumin: 4.5 g/dL (ref 3.9–4.9)
Alkaline Phosphatase: 66 IU/L (ref 44–121)
BUN/Creatinine Ratio: 26 (ref 12–28)
BUN: 20 mg/dL (ref 8–27)
Bilirubin Total: 0.3 mg/dL (ref 0.0–1.2)
CO2: 20 mmol/L (ref 20–29)
Calcium: 9.5 mg/dL (ref 8.7–10.3)
Chloride: 100 mmol/L (ref 96–106)
Creatinine, Ser: 0.78 mg/dL (ref 0.57–1.00)
Globulin, Total: 2.3 g/dL (ref 1.5–4.5)
Glucose: 81 mg/dL (ref 70–99)
Potassium: 4.1 mmol/L (ref 3.5–5.2)
Sodium: 140 mmol/L (ref 134–144)
Total Protein: 6.8 g/dL (ref 6.0–8.5)
eGFR: 82 mL/min/{1.73_m2} (ref >59)

## 2022-09-09 LAB — CBC WITH DIFFERENTIAL/PLATELET
Basophils Absolute: 0.1 10*3/uL (ref 0.0–0.2)
Basos: 1 %
EOS (ABSOLUTE): 0.5 10*3/uL — ABNORMAL HIGH (ref 0.0–0.4)
Eos: 6 %
Hematocrit: 40.5 % (ref 34.0–46.6)
Hemoglobin: 13.8 g/dL (ref 11.1–15.9)
Immature Grans (Abs): 0 10*3/uL (ref 0.0–0.1)
Immature Granulocytes: 0 %
Lymphocytes Absolute: 2.9 10*3/uL (ref 0.7–3.1)
Lymphs: 37 %
MCH: 34 pg — ABNORMAL HIGH (ref 26.6–33.0)
MCHC: 34.1 g/dL (ref 31.5–35.7)
MCV: 100 fL — ABNORMAL HIGH (ref 79–97)
Monocytes Absolute: 0.7 10*3/uL (ref 0.1–0.9)
Monocytes: 8 %
Neutrophils Absolute: 3.7 10*3/uL (ref 1.4–7.0)
Neutrophils: 48 %
Platelets: 257 10*3/uL (ref 150–450)
RBC: 4.06 x10E6/uL (ref 3.77–5.28)
RDW: 12.9 % (ref 11.7–15.4)
WBC: 7.8 10*3/uL (ref 3.4–10.8)

## 2022-09-09 LAB — LIPID PANEL W/O CHOL/HDL RATIO
Cholesterol, Total: 277 mg/dL — ABNORMAL HIGH (ref 100–199)
HDL: 88 mg/dL (ref >39)
LDL Chol Calc (NIH): 151 mg/dL — ABNORMAL HIGH (ref 0–99)
Triglycerides: 214 mg/dL — ABNORMAL HIGH (ref 0–149)
VLDL Cholesterol Cal: 38 mg/dL (ref 5–40)

## 2022-09-09 LAB — TSH: TSH: 1.14 u[IU]/mL (ref 0.450–4.500)

## 2022-09-09 LAB — VITAMIN D 25 HYDROXY (VIT D DEFICIENCY, FRACTURES): Vit D, 25-Hydroxy: 46.7 ng/mL (ref 30.0–100.0)

## 2022-09-09 NOTE — Progress Notes (Signed)
Contacted via MyChart The 10-year ASCVD risk score (Arnett DK, et al., 2019) is: 7.4%   Values used to calculate the score:     Age: 71 years     Sex: Female     Is Non-Hispanic African American: No     Diabetic: No     Tobacco smoker: No     Systolic Blood Pressure: 244 mmHg     Is BP treated: No     HDL Cholesterol: 88 mg/dL     Total Cholesterol: 277 mg/dL   Good morning Kristen May, your labs have returned: - CBC shows no anemia or infection. - Cholesterol labs remain elevated, but continue to recommend heavy focus on diet and exercise.  They did trend up with your vacation:) - Kidney function, creatinine and eGFR, remains normal, as is liver function, AST and ALT.  - Thyroid and Vitamin D normal.  Continue all supplements and medications at home.  Any questions? Keep being stellar!!  Thank you for allowing me to participate in your care.  I appreciate you. Kindest regards, Dorathy Stallone

## 2022-09-10 LAB — DRUG SCREEN 764883 11+OXYCO+ALC+CRT-BUND
Amphetamines, Urine: NEGATIVE ng/mL
BENZODIAZ UR QL: NEGATIVE ng/mL
Barbiturate: NEGATIVE ng/mL
Cannabinoid Quant, Ur: NEGATIVE ng/mL
Cocaine (Metabolite): NEGATIVE ng/mL
Creatinine: 69.8 mg/dL (ref 20.0–300.0)
Ethanol: NEGATIVE %
Meperidine: NEGATIVE ng/mL
Methadone Screen, Urine: NEGATIVE ng/mL
OPIATE SCREEN URINE: NEGATIVE ng/mL
Oxycodone/Oxymorphone, Urine: NEGATIVE ng/mL
Phencyclidine: NEGATIVE ng/mL
Propoxyphene: NEGATIVE ng/mL
Tramadol: NEGATIVE ng/mL
pH, Urine: 5.7 (ref 4.5–8.9)

## 2022-11-16 ENCOUNTER — Ambulatory Visit: Payer: Self-pay | Admitting: *Deleted

## 2022-11-16 NOTE — Telephone Encounter (Signed)
Reason for Disposition  Patient sounds very sick or weak to the triager  Answer Assessment - Initial Assessment Questions 1. LOCATION: "Where does it hurt?"       Sternum, "Blockage" 2. RADIATION: "Does the pain go anywhere else?" (e.g., into neck, jaw, arms, back)     No 3. ONSET: "When did the chest pain begin?" (Minutes, hours or days)      This AM 0400 4. PATTERN: "Does the pain come and go, or has it been constant since it started?"  "Does it get worse with exertion?"      Constant 5. DURATION: "How long does it last" (e.g., seconds, minutes, hours)      6. SEVERITY: "How bad is the pain?"  (e.g., Scale 1-10; mild, moderate, or severe)    - MILD (1-3): doesn't interfere with normal activities     - MODERATE (4-7): interferes with normal activities or awakens from sleep    - SEVERE (8-10): excruciating pain, unable to do any normal activities        9. CAUSE: "What do you think is causing the chest pain?"     "A blockage" 10. OTHER SYMPTOMS: "Do you have any other symptoms?" (e.g., dizziness, nausea, vomiting, sweating, fever, difficulty breathing, cough)  Protocols used: Chest Pain-A-AH

## 2022-11-16 NOTE — Telephone Encounter (Signed)
Called patient, no answer 

## 2022-11-16 NOTE — Telephone Encounter (Signed)
  Chief Complaint: "Blockage" Symptoms: States "Unable to get down any food or water, I feel a blockage at sternum." States gags and food and water come back up. Frequency: 9562 this Am Pertinent Negatives: Patient denies sob Disposition: [x] ED /[] Urgent Care (no appt availability in office) / [] Appointment(In office/virtual)/ []  [facility]/ [] Home Care/ [] Refused Recommended Disposition /[] [facility] [facility]/ []  Follow-up with PCP Additional Notes: Pt states had similar episode in 2022, "THere was a food blockage, had to go in and break it up, endoscopic at [facility]." Pt states presently can not eat or drink any fluids. States can feel blockage at sternum. Advised ED. Questioning if she can just be referred to clinic "I know what it is." Advised timeframe would be issue. Reiterated need for ED eval but assured pt NT would route to practice for PCPs review and final disposition.  CAlled during practices lunch break. Please advise. Unsure will go to ED.

## 2022-11-17 NOTE — Telephone Encounter (Signed)
Looks like patient went to the ED at [facility] patient to discuss message from yesterday  No answer. HIPAA compliant vm left requesting return call.  Seaman for nurse triage to give results if patient calls back.

## 2022-11-22 ENCOUNTER — Telehealth: Payer: Self-pay | Admitting: Nurse Practitioner

## 2022-11-22 NOTE — Telephone Encounter (Signed)
Copied from [place] [id number]. Topic: General - Other >> Nov 22, 2022  2:16 PM Oley Balm A wrote: Reason for CRM: Patient states that she was returning a phone call to [facility], she missed her phone call and was returning it. Per the message patient states Marnee Guarneri was checking on her and would like for her to call her back.

## 2023-03-06 NOTE — Patient Instructions (Signed)
Be Involved in Your Health Care:  Taking Medications When medications are taken as directed, they can greatly improve your health. But if they are not taken as instructed, they may not work. In some cases, not taking them correctly can be harmful. To help ensure your treatment remains effective and safe, understand your medications and how to take them.  Your lab results, notes and after visit summary will be available on My Chart. We strongly encourage you to use this feature. If lab results are abnormal the clinic will contact you with the appropriate steps. If the clinic does not contact you assume the results are satisfactory. You can always see your results on My Chart. If you have questions regarding your condition, please contact the clinic during office hours. You can also ask questions on My Chart.  We at [facility] are grateful that you chose us to provide care. We strive to provide excellent and compassionate care and are always looking for feedback. If you get a survey from the clinic please complete this.   Managing Anxiety, Adult After being diagnosed with anxiety, you may be relieved to know why you have felt or behaved a certain way. You may also feel overwhelmed about the treatment ahead and what it will mean for your life. With care and support, you can manage your anxiety. How to manage lifestyle changes Understanding the difference between stress and anxiety Although stress can play a role in anxiety, it is not the same as anxiety. Stress is your body's reaction to life changes and events, both good and bad. Stress is often caused by something external, such as a deadline, test, or competition. It normally goes away after the event has ended and will last just a few hours. But, stress can be ongoing and can lead to more than just stress. Anxiety is caused by something internal, such as imagining a terrible outcome or worrying that something will go wrong that will  greatly upset you. Anxiety often does not go away even after the event is over, and it can become a long-term (chronic) worry. Lowering stress and anxiety Talk with your health care provider or a counselor to learn more about lowering anxiety and stress. They may suggest tension-reduction techniques, such as: Music. Spend time creating or listening to music that you enjoy and that inspires you. Mindfulness-based meditation. Practice being aware of your normal breaths while not trying to control your breathing. It can be done while sitting or walking. Centering prayer. Focus on a word, phrase, or sacred image that means something to you and brings you peace. Deep breathing. Expand your stomach and inhale slowly through your nose. Hold your breath for 3-5 seconds. Then breathe out slowly, letting your stomach muscles relax. Self-talk. Learn to notice and spot thought patterns that lead to anxiety reactions. Change those patterns to thoughts that feel peaceful. Muscle relaxation. Take time to tense muscles and then relax them. Choose a tension-reduction technique that fits your lifestyle and personality. These techniques take time and practice. Set aside 5-15 minutes a day to do them. Specialized therapists can offer counseling and training in these techniques. The training to help with anxiety may be covered by some insurance plans. Other things you can do to manage stress and anxiety include: Keeping a stress diary. This can help you learn what triggers your reaction and then learn ways to manage your response. Thinking about how you react to certain situations. You may not be able to control everything,   but you can control your response. Making time for activities that help you relax and not feeling guilty about spending your time in this way. Doing visual imagery. This involves imagining or creating mental pictures to help you relax. Practicing yoga. Through yoga poses, you can lower tension and  relax.  Medicines Medicines for anxiety include: Antidepressant medicines. These are usually prescribed for long-term daily control. Anti-anxiety medicines. These may be added in severe cases, especially when panic attacks occur. When used together, medicines, psychotherapy, and tension-reduction techniques may be the most effective treatment. Relationships Relationships can play a big part in helping you recover. Spend more time connecting with trusted friends and family members. Think about going to couples counseling if you have a partner, taking family education classes, or going to family therapy. Therapy can help you and others better understand your anxiety. How to recognize changes in your anxiety Everyone responds differently to treatment for anxiety. Recovery from anxiety happens when symptoms lessen and stop interfering with your daily life at home or work. This may mean that you will start to: Have better concentration and focus. Worry will interfere less in your daily thinking. Sleep better. Be less irritable. Have more energy. Have improved memory. Try to recognize when your condition is getting worse. Contact your provider if your symptoms interfere with home or work and you feel like your condition is not improving. Follow these instructions at home: Activity Exercise. Adults should: Exercise for at least 150 minutes each week. The exercise should increase your heart rate and make you sweat (moderate-intensity exercise). Do strengthening exercises at least twice a week. Get the right amount and quality of sleep. Most adults need 7-9 hours of sleep each night. Lifestyle  Eat a healthy diet that includes plenty of vegetables, fruits, whole grains, low-fat dairy products, and lean protein. Do not eat a lot of foods that are high in fats, added sugars, or salt (sodium). Make choices that simplify your life. Do not use any products that contain nicotine or tobacco. These  products include cigarettes, chewing tobacco, and vaping devices, such as e-cigarettes. If you need help quitting, ask your provider. Avoid caffeine, alcohol, and certain over-the-counter cold medicines. These may make you feel worse. Ask your pharmacist which medicines to avoid. General instructions Take over-the-counter and prescription medicines only as told by your provider. Keep all follow-up visits. This is to make sure you are managing your anxiety well or if you need more support. Where to find support You can get help and support from: Self-help groups. Online and community organizations. A trusted spiritual leader. Couples counseling. Family education classes. Family therapy. Where to find more information You may find that joining a support group helps you deal with your anxiety. The following sources can help you find counselors or support groups near you: Mental Health America: mentalhealthamerica.net Anxiety and Depression Association of America (ADAA): adaa.org National Alliance on Mental Illness (NAMI): nami.org Contact a health care provider if: You have a hard time staying focused or finishing tasks. You spend many hours a day feeling worried about everyday life. You are very tired because you cannot stop worrying. You start to have headaches or often feel tense. You have chronic nausea or diarrhea. Get help right away if: Your heart feels like it is racing. You have shortness of breath. You have thoughts of hurting yourself or others. Get help right away if you feel like you may hurt yourself or others, or have thoughts about taking your own life.   Go to your nearest emergency room or: Call 911. Call the National Suicide Prevention Lifeline at 1-800-273-8255 or 988. This is open 24 hours a day. Text the Crisis Text Line at 741741. This information is not intended to replace advice given to you by your health care provider. Make sure you discuss any questions you have  with your health care provider. Document Revised: 08/10/2022 Document Reviewed: 02/22/2021 Elsevier Patient Education  2023 Elsevier Inc.  

## 2023-03-10 ENCOUNTER — Encounter: Payer: Self-pay | Admitting: Nurse Practitioner

## 2023-03-10 ENCOUNTER — Ambulatory Visit (INDEPENDENT_AMBULATORY_CARE_PROVIDER_SITE_OTHER): Payer: Medicare Other | Admitting: Nurse Practitioner

## 2023-03-10 VITALS — BP 118/76 | HR 55 | Temp 98.1°F | Ht 65.24 in | Wt 160.2 lb

## 2023-03-10 DIAGNOSIS — Z79899 Other long term (current) drug therapy: Secondary | ICD-10-CM | POA: Diagnosis not present

## 2023-03-10 DIAGNOSIS — F419 Anxiety disorder, unspecified: Secondary | ICD-10-CM

## 2023-03-10 DIAGNOSIS — E782 Mixed hyperlipidemia: Secondary | ICD-10-CM

## 2023-03-10 MED ORDER — LORAZEPAM 1 MG PO TABS: 1.0000 mg | ORAL_TABLET | Freq: Every evening | ORAL | 2 refills | Status: DC | PRN

## 2023-03-10 NOTE — Assessment & Plan Note (Signed)
Currently diet controlled with recent ASCVD 8.2%.  Continue diet focus and exercise -- recheck lipid panel today.  She prefers not to take medications.

## 2023-03-10 NOTE — Progress Notes (Signed)
BP 118/76 (BP Location: Left Arm, Patient Position: Sitting, Cuff Size: Normal)   Pulse (!) 55   Temp 98.1 F (36.7 C) (Oral)   Ht 5' 5.24" (1.657 m)   Wt 160 lb 3.2 oz (72.7 kg)   LMP  (LMP Unknown)   SpO2 98%   BMI 26.47 kg/m    Subjective:    Patient ID: Kristen May, female    DOB: 1951/03/02, 72 y.o.   MRN: [id number]  HPI: Kristen May is a 72 y.o. female  Chief Complaint  Patient presents with   Hyperlipidemia   Anxiety   HYPERLIPIDEMIA No medications at this time. Hyperlipidemia status: good compliance Supplements: none Aspirin:  no The 10-year ASCVD risk score (Arnett DK, et al., 2019) is: 8.2%   Values used to calculate the score:     Age: 57 years     Sex: Female     Is Non-Hispanic African American: No     Diabetic: No     Tobacco smoker: No     Systolic Blood Pressure: 118 mmHg     Is BP treated: No     HDL Cholesterol: 88 mg/dL     Total Cholesterol: 277 mg/dL Chest pain:  no Coronary artery disease:  no Family history CAD:  yes - father had heart issues Family history early CAD:  no  ANXIETY/STRESS Takes Ativan as needed, has been on for long period -- 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  She rarely takes this, gets refills once a year #30 pills.  Pt is aware of risks of benzo medication use to include increased sedation, respiratory suppression, falls, dependence and cardiovascular events. Pt would like to continue treatment as benefit determined to outweigh risk.  Last filled 08/30/22.  Duration:stable Anxious mood: no  Excessive worrying: no Irritability: no  Sweating: no Nausea: no Palpitations:no Hyperventilation: no Panic attacks: no Agoraphobia: no  Obscessions/compulsions: no Depressed mood: no    03/11/23   11:16 AM 09/08/2022    1:18 PM 08/05/2022   11:44 AM 08/05/2022   11:43 AM 07/09/2022   11:15 AM  Depression screen PHQ 2/9  Decreased Interest 0 0 0 0 0  Down, Depressed, Hopeless 0 0 0 0 0  PHQ - 2  Score 0 0 0 0 0  Altered sleeping 0 0 0 0 0  Tired, decreased energy 0 0 0 0 0  Change in appetite 0 0 0 0 0  Feeling bad or failure about yourself  0 0 0 0 0  Trouble concentrating 0 0 0 0 0  Moving slowly or fidgety/restless 0 0 0 0 0  Suicidal thoughts 0 0 0 0 0  PHQ-9 Score 0 0 0 0 0  Difficult doing work/chores Not difficult at all  Not difficult at all Not difficult at all Not difficult at all  Anhedonia: no Weight changes: no Insomnia: none Hypersomnia: no Fatigue/loss of energy: no Feelings of worthlessness: no Feelings of guilt: no Impaired concentration/indecisiveness: no Suicidal ideations: no  Crying spells: no Recent Stressors/Life Changes: no   Relationship problems: no   Family stress: no     Financial stress: no    Job stress: no    Recent death/loss: no     03-11-23   11:16 AM 09/08/2022    1:18 PM 07/09/2022   11:15 AM 04/27/2022    3:10 PM  GAD 7 : Generalized Anxiety Score  Nervous, Anxious, on Edge 0 0 0 0  Control/stop  worrying 0 0 0 0  Worry too much - different things 0 0 0 0  Trouble relaxing 0 0 0 0  Restless 0 0 0 0  Easily annoyed or irritable 0 0 0 0  Afraid - awful might happen 0 0 0 0  Total GAD 7 Score 0 0 0 0  Anxiety Difficulty Not difficult at all Not difficult at all Not difficult at all Not difficult at all      Relevant past medical, surgical, family and social history reviewed and updated as indicated. Interim medical history since our last visit reviewed. Allergies and medications reviewed and updated.  Review of Systems  Constitutional:  Negative for activity change, appetite change, diaphoresis, fatigue and fever.  Respiratory:  Negative for cough, chest tightness and shortness of breath.   Cardiovascular:  Negative for chest pain, palpitations and leg swelling.  Neurological: Negative.   Psychiatric/Behavioral: Negative.      Per HPI unless specifically indicated above     Objective:    BP 118/76 (BP Location: Left  Arm, Patient Position: Sitting, Cuff Size: Normal)   Pulse (!) 55   Temp 98.1 F (36.7 C) (Oral)   Ht 5' 5.24" (1.657 m)   Wt 160 lb 3.2 oz (72.7 kg)   LMP  (LMP Unknown)   SpO2 98%   BMI 26.47 kg/m   Wt Readings from Last 3 Encounters:  03/10/23 160 lb 3.2 oz (72.7 kg)  09/08/22 162 lb 14.4 oz (73.9 kg)  07/09/22 154 lb 3.2 oz (69.9 kg)    Physical Exam Vitals and nursing note reviewed.  Constitutional:      General: She is awake. She is not in acute distress.    Appearance: She is well-developed and well-groomed. She is not ill-appearing or toxic-appearing.  HENT:     Head: Normocephalic.     Right Ear: Hearing normal.     Left Ear: Hearing normal.     Nose: Nose normal.  Eyes:     General: Lids are normal.        Right eye: No discharge.        Left eye: No discharge.     Conjunctiva/sclera: Conjunctivae normal.     Pupils: Pupils are equal, round, and reactive to light.  Neck:     Thyroid: No thyromegaly.     Vascular: No carotid bruit.  Cardiovascular:     Rate and Rhythm: Normal rate and regular rhythm.     Heart sounds: Normal heart sounds. No murmur heard.    No gallop.  Pulmonary:     Effort: Pulmonary effort is normal. No accessory muscle usage or respiratory distress.     Breath sounds: Normal breath sounds.  Abdominal:     General: Bowel sounds are normal.     Palpations: Abdomen is soft.  Musculoskeletal:     Cervical back: Normal range of motion and neck supple.     Right lower leg: No edema.     Left lower leg: No edema.  Lymphadenopathy:     Cervical: No cervical adenopathy.  Skin:    General: Skin is warm and dry.  Neurological:     Mental Status: She is alert and oriented to person, place, and time.  Psychiatric:        Attention and Perception: Attention normal.        Mood and Affect: Mood normal.        Speech: Speech normal.        Behavior: Behavior  normal. Behavior is cooperative.        Thought Content: Thought content normal.      Results for orders placed or performed in visit on 09/08/22  CBC with Differential/Platelet  Result Value Ref Range   WBC 7.8 3.4 - 10.8 x10E3/uL   RBC 4.06 3.77 - 5.28 x10E6/uL   Hemoglobin 13.8 11.1 - 15.9 g/dL   Hematocrit 16.1 09.6 - 46.6 %   MCV 100 (H) 79 - 97 fL   MCH 34.0 (H) 26.6 - 33.0 pg   MCHC 34.1 31.5 - 35.7 g/dL   RDW 04.5 40.9 - 81.1 %   Platelets 257 150 - 450 x10E3/uL   Neutrophils 48 Not Estab. %   Lymphs 37 Not Estab. %   Monocytes 8 Not Estab. %   Eos 6 Not Estab. %   Basos 1 Not Estab. %   Neutrophils Absolute 3.7 1.4 - 7.0 x10E3/uL   Lymphocytes Absolute 2.9 0.7 - 3.1 x10E3/uL   Monocytes Absolute 0.7 0.1 - 0.9 x10E3/uL   EOS (ABSOLUTE) 0.5 (H) 0.0 - 0.4 x10E3/uL   Basophils Absolute 0.1 0.0 - 0.2 x10E3/uL   Immature Granulocytes 0 Not Estab. %   Immature Grans (Abs) 0.0 0.0 - 0.1 x10E3/uL  Comprehensive metabolic panel  Result Value Ref Range   Glucose 81 70 - 99 mg/dL   BUN 20 8 - 27 mg/dL   Creatinine, Ser 9.14 0.57 - 1.00 mg/dL   eGFR 82 >78 GN/FAO/1.30   BUN/Creatinine Ratio 26 12 - 28   Sodium 140 134 - 144 mmol/L   Potassium 4.1 3.5 - 5.2 mmol/L   Chloride 100 96 - 106 mmol/L   CO2 20 20 - 29 mmol/L   Calcium 9.5 8.7 - 10.3 mg/dL   Total Protein 6.8 6.0 - 8.5 g/dL   Albumin 4.5 3.9 - 4.9 g/dL   Globulin, Total 2.3 1.5 - 4.5 g/dL   Albumin/Globulin Ratio 2.0 1.2 - 2.2   Bilirubin Total 0.3 0.0 - 1.2 mg/dL   Alkaline Phosphatase 66 44 - 121 IU/L   AST 22 0 - 40 IU/L   ALT 25 0 - 32 IU/L  Lipid Panel w/o Chol/HDL Ratio  Result Value Ref Range   Cholesterol, Total 277 (H) 100 - 199 mg/dL   Triglycerides 865 (H) 0 - 149 mg/dL   HDL 88 >78 mg/dL   VLDL Cholesterol Cal 38 5 - 40 mg/dL   LDL Chol Calc (NIH) 469 (H) 0 - 99 mg/dL  TSH  Result Value Ref Range   TSH 1.140 0.450 - 4.500 uIU/mL  VITAMIN D 25 Hydroxy (Vit-D Deficiency, Fractures)  Result Value Ref Range   Vit D, 25-Hydroxy 46.7 30.0 - 100.0 ng/mL  [id number]  11+Oxyco+Alc+Crt-Bund  Result Value Ref Range   Ethanol Negative Cutoff=0.020 %   Amphetamines, Urine Negative Cutoff=1000 ng/mL   Barbiturate Negative Cutoff=200 ng/mL   BENZODIAZ UR QL Negative Cutoff=200 ng/mL   Cannabinoid Quant, Ur Negative Cutoff=50 ng/mL   Cocaine (Metabolite) Negative Cutoff=300 ng/mL   OPIATE SCREEN URINE Negative Cutoff=300 ng/mL   Oxycodone/Oxymorphone, Urine Negative Cutoff=300 ng/mL   Phencyclidine Negative Cutoff=25 ng/mL   Methadone Screen, Urine Negative Cutoff=300 ng/mL   Propoxyphene Negative Cutoff=300 ng/mL   Meperidine Negative Cutoff=200 ng/mL   Tramadol Negative Cutoff=200 ng/mL   Creatinine 69.8 20.0 - 300.0 mg/dL   pH, Urine 5.7 4.5 - 8.9      Assessment & Plan:   Problem List Items Addressed This Visit  Other   Anxiety    Chronic, stable with minimal use of Ativan.  #30 pills lasts 6 + months most often on PDMP review and discussion with patient.  She is aware of risks of benzo use chronically.  UDS due 09/09/23, obtain contract next visit.  Denies SI/HI.      Relevant Medications   LORazepam (ATIVAN) 1 MG tablet   Hyperlipidemia - Primary    Currently diet controlled with recent ASCVD 8.2%.  Continue diet focus and exercise -- recheck lipid panel today.  She prefers not to take medications.      Relevant Orders   Comprehensive metabolic panel   Lipid Panel w/o Chol/HDL Ratio   Long-term current use of benzodiazepine    Reviewed anxiety plan of care.        Follow up plan: Return in about 6 months (around 09/09/2023) for Annual Exam.

## 2023-03-10 NOTE — Assessment & Plan Note (Signed)
Chronic, stable with minimal use of Ativan.  #30 pills lasts 6 + months most often on PDMP review and discussion with patient.  She is aware of risks of benzo use chronically.  UDS due 09/09/23, obtain contract next visit.  Denies SI/HI.

## 2023-03-10 NOTE — Assessment & Plan Note (Signed)
Reviewed anxiety plan of care. 

## 2023-03-11 LAB — COMPREHENSIVE METABOLIC PANEL
ALT: 31 IU/L (ref 0–32)
AST: 20 IU/L (ref 0–40)
Albumin/Globulin Ratio: 2.1 (ref 1.2–2.2)
Albumin: 4.5 g/dL (ref 3.9–4.9)
Alkaline Phosphatase: 73 IU/L (ref 44–121)
BUN/Creatinine Ratio: 17 (ref 12–28)
BUN: 15 mg/dL (ref 8–27)
Bilirubin Total: 0.4 mg/dL (ref 0.0–1.2)
CO2: 24 mmol/L (ref 20–29)
Calcium: 9.9 mg/dL (ref 8.7–10.3)
Chloride: 105 mmol/L (ref 96–106)
Creatinine, Ser: 0.9 mg/dL (ref 0.57–1.00)
Globulin, Total: 2.1 g/dL (ref 1.5–4.5)
Glucose: 83 mg/dL (ref 70–99)
Potassium: 4.5 mmol/L (ref 3.5–5.2)
Sodium: 142 mmol/L (ref 134–144)
Total Protein: 6.6 g/dL (ref 6.0–8.5)
eGFR: 69 mL/min/{1.73_m2} (ref >59)

## 2023-03-11 LAB — LIPID PANEL W/O CHOL/HDL RATIO
Cholesterol, Total: 244 mg/dL — ABNORMAL HIGH (ref 100–199)
HDL: 69 mg/dL (ref >39)
LDL Chol Calc (NIH): 161 mg/dL — ABNORMAL HIGH (ref 0–99)
Triglycerides: 84 mg/dL (ref 0–149)
VLDL Cholesterol Cal: 14 mg/dL (ref 5–40)

## 2023-03-11 NOTE — Progress Notes (Signed)
Contacted via MyChart   Good evening Kristen May, your labs have returned: - Kidney function, creatinine and eGFR, remains normal, as is liver function, AST and ALT.  - Cholesterol levels remain elevated, you may benefit from a low dose of Rosuvastatin since levels remain elevated and ASCVD 10 year risk score we have discussed is elevated.  I will place below.  Focus heavily on diet and exercise.  We will recheck next visit.  Come fasting.  Any questions? The 10-year ASCVD risk score (Arnett DK, et al., 2019) is: 8.3%   Values used to calculate the score:     Age: 72 years     Sex: Female     Is Non-Hispanic African American: No     Diabetic: No     Tobacco smoker: No     Systolic Blood Pressure: 118 mmHg     Is BP treated: No     HDL Cholesterol: 69 mg/dL     Total Cholesterol: 244 mg/dL Keep being awesome!!  Thank you for allowing me to participate in your care.  I appreciate you. Kindest regards, Rylynn Kobs

## 2023-04-01 ENCOUNTER — Ambulatory Visit (INDEPENDENT_AMBULATORY_CARE_PROVIDER_SITE_OTHER): Payer: Medicare Other | Admitting: Nurse Practitioner

## 2023-04-01 ENCOUNTER — Encounter: Payer: Self-pay | Admitting: Nurse Practitioner

## 2023-04-01 ENCOUNTER — Ambulatory Visit
Admission: RE | Admit: 2023-04-01 | Discharge: 2023-04-01 | Disposition: A | Discharge status: Home / Self Care | Payer: Medicare Other | Source: Ambulatory Visit | Attending: Nurse Practitioner | Admitting: Nurse Practitioner

## 2023-04-01 VITALS — BP 132/84 | HR 54 | Temp 98.2°F | Ht 65.24 in | Wt 160.2 lb

## 2023-04-01 DIAGNOSIS — M79641 Pain in right hand: Secondary | ICD-10-CM

## 2023-04-01 MED ORDER — TRAMADOL HCL 50 MG PO TABS: 50.0000 mg | ORAL_TABLET | Freq: Two times a day (BID) | ORAL | 0 refills | Status: AC | PRN

## 2023-04-01 NOTE — Patient Instructions (Signed)
Hand Pain Hand pain can make it hard to do daily activities. Many things can cause hand pain. Some common causes are: Injuries. These may include: Broken bones (fractures)and cuts. Overuse injuries from doing the same movements many times (repetitive activity). Arthritis. Lumps in the tendons or joints of the hand and wrist (ganglion cysts). Nerve compression syndromes (carpal tunnel syndrome). Inflammation of the tendons (tendinitis). Infection. Follow these instructions at home: Managing pain, stiffness, and swelling     Take over-the-counter and prescription medicines only as told by your health care provider. If told, put ice on the affected area. Put ice in a plastic bag. Place a towel between your skin and the bag. Leave the ice on for 20 minutes, 2-3 times a day. If told, apply heat to the affected area before you exercise or as often as told by your provider. Use the heat source that your provider recommends, such as a moist heat pack or a heating pad. Place a towel between your skin and the heat source. Leave the heat on for 20-30 minutes. If your skin turns bright red, remove the ice or heat right away to prevent skin damage. The risk of damage is higher if you cannot feel pain, heat, or cold. Activity Take breaks from repetitive activity often. Minimize stress on your hands and wrists as much as possible. Do stretches or exercises as told by your provider. Do not do activities that make your pain worse. Wear a hand splint or support as told by your provider. Contact a health care provider if: Your pain does not get better after a few days. Your pain gets worse. Your pain affects your ability to do your daily activities. Your hand becomes warm, red, or swollen. Your hand is numb or tingling. Get help right away if: Your hand is extremely swollen or is an unusual shape. Your hand or fingers turn white or blue. You cannot move your hand, wrist, or fingers. This  information is not intended to replace advice given to you by your health care provider. Make sure you discuss any questions you have with your health care provider. Document Revised: 06/09/2022 Document Reviewed: 06/09/2022 Elsevier Patient Education  2023 Elsevier Inc.  

## 2023-04-01 NOTE — Progress Notes (Signed)
Contacted via MyChart   Good news, there is no fracture.  Suspect more sprain then.  For this I would recommend maintain support of area, even wearing a compression hand sleeve.  Use pain medication as needed.  Ice application and rest.  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  Any questions?

## 2023-04-01 NOTE — Assessment & Plan Note (Signed)
Acute for 24 hours after injury to right thumb area.  Decreased ROM and swelling.  ?suspect sprain to area.  Will obtain imaging to further assess.  Is right hand dominant.  Leaving for trip on Wednesday.  Recommend continue Tylenol as needed, max 3000 MG total daily only.  Apply ice and may splint area for comfort.  Voltaren gel to area as needed.  Will send in short burst of Tramadol for pain 50 MG Q12H as needed -- has taken before per her report and tolerated.  If fracture present will send her to ortho.

## 2023-04-01 NOTE — Progress Notes (Signed)
BP 132/84 (BP Location: Left Arm, Patient Position: Sitting, Cuff Size: Normal)   Pulse (!) 54   Temp 98.2 F (36.8 C) (Oral)   Ht 5' 5.24" (1.657 m)   Wt 160 lb 3.2 oz (72.7 kg)   LMP  (LMP Unknown)   SpO2 98%   BMI 26.47 kg/m    Subjective:    Patient ID: Kristen May, female    DOB: 24-May-1951, 72 y.o.   MRN: [id number]  HPI: Kristen May is a 72 y.o. female  Chief Complaint  Patient presents with   Thumb pain    Right thumb pain   THUMB PAIN (RIGHT) Last night around 6 pm had a heavy bag on shoulder, when went to take strap off it slipped and came down with full force onto the right thumb pulling it down onto joint area.  Now is unable to move right thumb.  Right hand dominant. Duration: days Involved hand: right Mechanism of injury: trauma Location: diffuse Onset: sudden Severity: 7/10  Quality: dull, aching, and throbbing Frequency: constant Radiation: no Aggravating factors: movement -- wrapping area felt worse Alleviating factors: nothing Treatments attempted: Tylenol, ice, wrapping area Relief with NSAIDs?: No NSAIDs Taken Weakness: yes Numbness: yes Redness: no Swelling:yes Bruising: no Fevers: no   Relevant past medical, surgical, family and social history reviewed and updated as indicated. Interim medical history since our last visit reviewed. Allergies and medications reviewed and updated.  Review of Systems  Constitutional:  Negative for activity change, appetite change, diaphoresis, fatigue and fever.  Respiratory:  Negative for cough, chest tightness and shortness of breath.   Cardiovascular:  Negative for chest pain, palpitations and leg swelling.  Musculoskeletal:  Positive for arthralgias.  Neurological: Negative.   Psychiatric/Behavioral: Negative.      Per HPI unless specifically indicated above     Objective:    BP 132/84 (BP Location: Left Arm, Patient Position: Sitting, Cuff Size: Normal)   Pulse (!) 54   Temp 98.2 F (36.8 C)  (Oral)   Ht 5' 5.24" (1.657 m)   Wt 160 lb 3.2 oz (72.7 kg)   LMP  (LMP Unknown)   SpO2 98%   BMI 26.47 kg/m   Wt Readings from Last 3 Encounters:  04/01/23 160 lb 3.2 oz (72.7 kg)  03/10/23 160 lb 3.2 oz (72.7 kg)  09/08/22 162 lb 14.4 oz (73.9 kg)    Physical Exam Vitals and nursing note reviewed.  Constitutional:      General: She is awake. She is not in acute distress.    Appearance: She is well-developed and well-groomed. She is not ill-appearing or toxic-appearing.  HENT:     Head: Normocephalic.     Right Ear: Hearing normal.     Left Ear: Hearing normal.     Nose: Nose normal.  Eyes:     General: Lids are normal.        Right eye: No discharge.        Left eye: No discharge.     Conjunctiva/sclera: Conjunctivae normal.     Pupils: Pupils are equal, round, and reactive to light.  Neck:     Vascular: No carotid bruit.  Cardiovascular:     Rate and Rhythm: Normal rate and regular rhythm.     Heart sounds: Normal heart sounds. No murmur heard.    No gallop.  Pulmonary:     Effort: Pulmonary effort is normal. No accessory muscle usage or respiratory distress.     Breath sounds: Normal breath  sounds.  Abdominal:     General: Bowel sounds are normal.     Palpations: Abdomen is soft.  Musculoskeletal:     Right wrist: No swelling, tenderness or snuff box tenderness. Normal range of motion.     Left wrist: Normal.     Right hand: Swelling (to lower aspect of thumb area) and tenderness present. No bony tenderness. Decreased range of motion (decreased flexion/extension thumb). Decreased strength of finger abduction. Normal sensation. Normal capillary refill. Normal pulse.     Left hand: Normal.     Cervical back: Normal range of motion and neck supple.     Right lower leg: No edema.     Left lower leg: No edema.  Skin:    General: Skin is warm and dry.  Neurological:     Mental Status: She is alert and oriented to person, place, and time.  Psychiatric:         Attention and Perception: Attention normal.        Mood and Affect: Mood normal.        Speech: Speech normal.        Behavior: Behavior normal. Behavior is cooperative.        Thought Content: Thought content normal.    Results for orders placed or performed in visit on 03/10/23  Comprehensive metabolic panel  Result Value Ref Range   Glucose 83 70 - 99 mg/dL   BUN 15 8 - 27 mg/dL   Creatinine, Ser 0.98 0.57 - 1.00 mg/dL   eGFR 69 >11 BJ/YNW/2.95   BUN/Creatinine Ratio 17 12 - 28   Sodium 142 134 - 144 mmol/L   Potassium 4.5 3.5 - 5.2 mmol/L   Chloride 105 96 - 106 mmol/L   CO2 24 20 - 29 mmol/L   Calcium 9.9 8.7 - 10.3 mg/dL   Total Protein 6.6 6.0 - 8.5 g/dL   Albumin 4.5 3.9 - 4.9 g/dL   Globulin, Total 2.1 1.5 - 4.5 g/dL   Albumin/Globulin Ratio 2.1 1.2 - 2.2   Bilirubin Total 0.4 0.0 - 1.2 mg/dL   Alkaline Phosphatase 73 44 - 121 IU/L   AST 20 0 - 40 IU/L   ALT 31 0 - 32 IU/L  Lipid Panel w/o Chol/HDL Ratio  Result Value Ref Range   Cholesterol, Total 244 (H) 100 - 199 mg/dL   Triglycerides 84 0 - 149 mg/dL   HDL 69 >62 mg/dL   VLDL Cholesterol Cal 14 5 - 40 mg/dL   LDL Chol Calc (NIH) 130 (H) 0 - 99 mg/dL      Assessment & Plan:   Problem List Items Addressed This Visit       Other   Right hand pain - Primary    Acute for 24 hours after injury to right thumb area.  Decreased ROM and swelling.  ?suspect sprain to area.  Will obtain imaging to further assess.  Is right hand dominant.  Leaving for trip on Wednesday.  Recommend continue Tylenol as needed, max 3000 MG total daily only.  Apply ice and may splint area for comfort.  Voltaren gel to area as needed.  Will send in short burst of Tramadol for pain 50 MG Q12H as needed -- has taken before per her report and tolerated.  If fracture present will send her to ortho.      Relevant Orders   DG Hand Complete Right     Follow up plan: Return if symptoms worsen or fail to improve.

## 2023-08-08 ENCOUNTER — Ambulatory Visit: Payer: Medicare Other

## 2023-08-30 ENCOUNTER — Ambulatory Visit: Payer: Medicare Other | Admitting: Emergency Medicine

## 2023-08-30 VITALS — Ht 65.5 in | Wt 153.0 lb

## 2023-08-30 DIAGNOSIS — Z0000 Encounter for general adult medical examination without abnormal findings: Secondary | ICD-10-CM | POA: Diagnosis not present

## 2023-08-30 NOTE — Progress Notes (Signed)
Subjective:   Kristen May is a 72 y.o. female who presents for Medicare Annual (Subsequent) preventive examination.  Visit Complete: Virtual I connected with  Safaa Stingley on 08/30/23 by a audio enabled telemedicine application and verified that I am speaking with the correct person using two identifiers.  Patient Location: Home  Provider Location: Office/Clinic  I discussed the limitations of evaluation and management by telemedicine. The patient expressed understanding and agreed to proceed.  Vital Signs: Because this visit was a virtual/telehealth visit, some criteria may be missing or patient reported. Any vitals not documented were not able to be obtained and vitals that have been documented are patient reported.   Cardiac Risk Factors include: advanced age (>41men, >34 women);dyslipidemia     Objective:    Today's Vitals   08/30/23 1542  Weight: 153 lb (69.4 kg)  Height: 5' 5.5" (1.664 m)   Body mass index is 25.07 kg/m.     08/30/2023    4:00 PM 07/29/2021    5:14 PM  Advanced Directives  Does Patient Have a Medical Advance Directive? No No  Would patient like information on creating a medical advance directive? Yes (MAU/Ambulatory/Procedural Areas - Information given) No - Patient declined    Current Medications (verified) Outpatient Encounter Medications as of 08/30/2023  Medication Sig   Cholecalciferol 25 MCG (1000 UT) tablet Take 2,000 Units by mouth daily.   GLYCINE PO Take 5 mLs by mouth daily.   loperamide (IMODIUM) 2 MG capsule Take 2 mg by mouth 3 (three) times daily as needed for diarrhea or loose stools.   LORazepam (ATIVAN) 1 MG tablet Take 1 tablet (1 mg total) by mouth at bedtime as needed for anxiety.   pantoprazole (PROTONIX) 40 MG tablet Take 40 mg by mouth 2 (two) times daily.   No facility-administered encounter medications on file as of 08/30/2023.    Allergies (verified) Morphine and codeine and Lactose   History: Past Medical History:   Diagnosis Date   Anxiety    Back pain    Cervicalgia    Depression    Elevated vitamin B12 level    Insomnia    Lumbago    Osteopenia    Past Surgical History:  Procedure Laterality Date   CHOLECYSTECTOMY     CHOLESTEATOMA EXCISION     eye surgerry     SKIN GRAFT     Family History  Problem Relation Age of Onset   Cancer Mother        stomach   COPD Mother    Heart disease Father    Cancer Father        colon   Diabetes Father    Polycystic ovary syndrome Daughter    Cancer Paternal Grandfather        rectal   Depression Daughter    Bipolar disorder Daughter    Social History   Socioeconomic History   Marital status: Widowed    Spouse name: Not on file   Number of children: 4   Years of education: Not on file   Highest education level: Not on file  Occupational History   Occupation: retired  Tobacco Use   Smoking status: Former    Current packs/day: 0.00    Average packs/day: 3.0 packs/day for 7.0 years (21.0 ttl pk-yrs)    Types: Cigarettes    Start date: 37    Quit date: 1980    Years since quitting: 44.8   Smokeless tobacco: Never  Vaping Use   Vaping  status: Never Used  Substance and Sexual Activity   Alcohol use: Yes    Alcohol/week: 6.0 standard drinks of alcohol    Types: 6 Standard drinks or equivalent per week    Comment: 3 drinks, 2 days on the weekends   Drug use: No   Sexual activity: Yes  Other Topics Concern   Not on file  Social History Narrative   Not on file   Social Determinants of Health   Financial Resource Strain: Low Risk  (08/30/2023)   Overall Financial Resource Strain (CARDIA)    Difficulty of Paying Living Expenses: Not hard at all  Food Insecurity: No Food Insecurity (08/30/2023)   Hunger Vital Sign    Worried About Running Out of Food in the Last Year: Never true    Ran Out of Food in the Last Year: Never true  Transportation Needs: No Transportation Needs (08/30/2023)   PRAPARE - Scientist, research (physical sciences) (Medical): No    Lack of Transportation (Non-Medical): No  Physical Activity: Sufficiently Active (08/30/2023)   Exercise Vital Sign    Days of Exercise per Week: 7 days    Minutes of Exercise per Session: 60 min  Stress: No Stress Concern Present (08/30/2023)   [employer] of Occupational Health - Occupational Stress Questionnaire    Feeling of Stress : Only a little  Social Connections: Socially Isolated (08/30/2023)   Social Connection and Isolation Panel [NHANES]    Frequency of Communication with Friends and Family: Never    Frequency of Social Gatherings with Friends and Family: More than three times a week    Attends Religious Services: Never    Database administrator or Organizations: No    Attends Banker Meetings: Never    Marital Status: Widowed    Tobacco Counseling Counseling given: Not Answered   Clinical Intake:  Pre-visit preparation completed: Yes  Pain : No/denies pain     BMI - recorded: 25.07 Nutritional Status: BMI 25 -29 Overweight Nutritional Risks: None Diabetes: No  How often do you need to have someone help you when you read instructions, pamphlets, or other written materials from your doctor or pharmacy?: 1 - Never  Interpreter Needed?: No  Information entered by :: Tora Kindred, CMA   Activities of Daily Living    08/30/2023    3:45 PM 09/08/2022    1:24 PM  In your present state of health, do you have any difficulty performing the following activities:  Hearing? 1 0  Comment wears a hearing aid   Vision? 0 0  Difficulty concentrating or making decisions? 0 0  Walking or climbing stairs? 0 0  Dressing or bathing? 0 0  Doing errands, shopping? 0 0  Preparing Food and eating ? N   Using the Toilet? N   In the past six months, have you accidently leaked urine? N   Do you have problems with loss of bowel control? Y   Comment diarrhea, takes Pepto and seeing GI   Managing your Medications? N    Managing your Finances? N   Housekeeping or managing your Housekeeping? N     Patient Care Team: Marjie Skiff, NP as PCP - General (Nurse Practitioner)  Indicate any recent Medical Services you may have received from other than [facility] providers in the past year (date may be approximate).     Assessment:   This is a routine wellness examination for [facility].  Hearing/Vision screen Hearing Screening - Comments:: Wears  hearing aids Vision Screening - Comments:: Gets routine eye exams   Goals Addressed               This Visit's Progress     Patient Stated (pt-stated)        Maintain current health      Depression Screen    08/30/2023    3:58 PM 03/10/2023   11:16 AM 09/08/2022    1:18 PM 08/05/2022   11:44 AM 08/05/2022   11:43 AM 07/09/2022   11:15 AM 04/27/2022    3:09 PM  PHQ 2/9 Scores  PHQ - 2 Score 0 0 0 0 0 0 0  PHQ- 9 Score 0 0 0 0 0 0 0    Fall Risk    08/30/2023    4:01 PM 03/10/2023   11:16 AM 09/08/2022    1:24 PM 09/08/2022    1:17 PM 08/05/2022   11:38 AM  Fall Risk   Falls in the past year? 0 0 0 0 0  Number falls in past yr: 0 0 0 0 0  Injury with Fall? 0 0 0 0 0  Risk for fall due to : No Fall Risks No Fall Risks No Fall Risks No Fall Risks   Follow up Falls prevention discussed Falls evaluation completed Education provided Falls evaluation completed Falls evaluation completed;Education provided;Falls prevention discussed    MEDICARE RISK AT HOME: Medicare Risk at Home Any stairs in or around the home?: Yes If so, are there any without handrails?: No Home free of loose throw rugs in walkways, pet beds, electrical cords, etc?: Yes Adequate lighting in your home to reduce risk of falls?: Yes Life alert?: No Use of a cane, walker or w/c?: No Grab bars in the bathroom?: No Shower chair or bench in shower?: No Elevated toilet seat or a handicapped toilet?: No  TIMED UP AND GO:  Was the test performed?  No    Cognitive Function:         08/30/2023    4:02 PM 08/05/2022   11:37 AM 07/29/2021    5:16 PM  6CIT Screen  What Year? 0 points 0 points 0 points  What month? 0 points 0 points 0 points  What time? 0 points 0 points 0 points  Count back from 20 0 points 0 points 0 points  Months in reverse 0 points 0 points 0 points  Repeat phrase 0 points 0 points 0 points  Total Score 0 points 0 points 0 points    Immunizations Immunization History  Administered Date(s) Administered   Fluad Quad(high Dose 65+) 08/24/2017, 10/25/2018, 08/02/2019, 08/12/2020, 08/25/2021   Influenza, High Dose Seasonal PF 08/24/2017, 10/25/2018, 07/02/2022   Influenza,inj,Quad PF,6+ Mos 11/27/2015, 08/23/2016   PFIZER(Purple Top)SARS-COV-2 Vaccination 12/10/2019, 01/01/2020, 08/12/2020, 02/18/2021, 08/09/2022   Pfizer Covid-19 Vaccine Bivalent Booster 65yrs & up 08/27/2021   Pfizer(Comirnaty)Fall Seasonal Vaccine 12 years and older 08/09/2022   Pneumococcal Conjugate-13 08/24/2017   Pneumococcal Polysaccharide-23 03/07/2020   Pneumococcal-Unspecified 07/29/2014   Respiratory Syncytial Virus Vaccine,Recomb Aduvanted(Arexvy) 10/05/2022   Td 02/21/2007   Td (Adult), 2 Lf Tetanus Toxid, Preservative Free 02/21/2007   Tdap 08/24/2017   Zoster Recombinant(Shingrix) 10/26/2022    TDAP status: Up to date  Flu Vaccine status: Due, Education has been provided regarding the importance of this vaccine. Advised may receive this vaccine at local pharmacy or Health Dept. Aware to provide a copy of the vaccination record if obtained from local pharmacy or Health Dept. Verbalized acceptance and understanding.  Pneumococcal vaccine status: Up to date  Covid-19 vaccine status: Information provided on how to obtain vaccines.   Qualifies for Shingles Vaccine? Yes   Zostavax completed No   Shingrix Completed?: No.    Education has been provided regarding the importance of this vaccine. Patient has been advised to call insurance company to determine out  of pocket expense if they have not yet received this vaccine. Advised may also receive vaccine at local pharmacy or Health Dept. Verbalized acceptance and understanding.  Screening Tests Health Maintenance  Topic Date Due   Zoster Vaccines- Shingrix (2 of 2) 12/21/2022   COVID-19 Vaccine (7 - 2023-24 season) 07/17/2023   INFLUENZA VACCINE  02/13/2024 (Originally 06/16/2023)   Medicare Annual Wellness (AWV)  08/29/2024   Colonoscopy  03/31/2027   DEXA SCAN  07/28/2027   DTaP/Tdap/Td (4 - Td or Tdap) 08/25/2027   Pneumonia Vaccine 29+ Years old  Completed   Hepatitis C Screening  Completed   HPV VACCINES  Aged Out   MAMMOGRAM  Discontinued    Health Maintenance  Health Maintenance Due  Topic Date Due   Zoster Vaccines- Shingrix (2 of 2) 12/21/2022   COVID-19 Vaccine (7 - 2023-24 season) 07/17/2023    Colorectal cancer screening: Type of screening: Colonoscopy. Completed 03/30/22. Repeat every 5 years  Mammogram Status: Patient declined.  Bone Density status: Completed 07/27/22. Results reflect: Bone density results: OSTEOPENIA. Repeat every 5 years.  Lung Cancer Screening: (Low Dose CT Chest recommended if Age 43-80 years, 20 pack-year currently smoking OR have quit w/in 15years.) does not qualify.   Lung Cancer Screening Referral: n/a  Additional Screening:  Hepatitis C Screening: does not qualify; Completed 08/25/21  Vision Screening: Recommended annual ophthalmology exams for early detection of glaucoma and other disorders of the eye.  Dental Screening: Recommended annual dental exams for proper oral hygiene    Community Resource Referral / Chronic Care Management: CRR required this visit?  No   CCM required this visit?  No     Plan:     I have personally reviewed and noted the following in the patient's chart:   Medical and social history Use of alcohol, tobacco or illicit drugs  Current medications and supplements including opioid prescriptions. Patient is  not currently taking opioid prescriptions. Functional ability and status Nutritional status Physical activity Advanced directives List of other physicians Hospitalizations, surgeries, and ER visits in previous 12 months Vitals Screenings to include cognitive, depression, and falls Referrals and appointments  In addition, I have reviewed and discussed with patient certain preventive protocols, quality metrics, and best practice recommendations. A written personalized care plan for preventive services as well as general preventive health recommendations were provided to patient.     Tora Kindred, CMA   08/30/2023   After Visit Summary: (MyChart) Due to this being a telephonic visit, the after visit summary with patients personalized plan was offered to patient via MyChart   Nurse Notes:  Needs flu shot. Declined MMG

## 2023-08-30 NOTE — Patient Instructions (Signed)
Kristen May , Thank you for taking time to come for your Medicare Wellness Visit. I appreciate your ongoing commitment to your health goals. Please review the following plan we discussed and let me know if I can assist you in the future.   Referrals/Orders/Follow-Ups/Clinician Recommendations: Get the flu shot at your earliest convenience.  This is a list of the screening recommended for you and due dates:  Health Maintenance  Topic Date Due   Zoster (Shingles) Vaccine (2 of 2) 12/21/2022   COVID-19 Vaccine (7 - 2023-24 season) 07/17/2023   Flu Shot  02/13/2024*   Medicare Annual Wellness Visit  08/29/2024   Colon Cancer Screening  03/31/2027   DEXA scan (bone density measurement)  07/28/2027   DTaP/Tdap/Td vaccine (4 - Td or Tdap) 08/25/2027   Pneumonia Vaccine  Completed   Hepatitis C Screening  Completed   HPV Vaccine  Aged Out   Mammogram  Discontinued  *Topic was postponed. The date shown is not the original due date.    Advanced directives: (ACP Link)Information on Advanced Care Planning can be found at [facility] of [facility] Advance Health Care Directives Advance Health Care Directives ([email or web address]   Please bring a copy of your health care power of attorney and living will to the office to be added to your chart at your convenience.   Next Medicare Annual Wellness Visit scheduled for next year: Yes, 09/04/24 @ 3:40pm

## 2023-09-23 ENCOUNTER — Encounter: Payer: Medicare Other | Admitting: Nurse Practitioner

## 2023-10-09 DIAGNOSIS — K200 Eosinophilic esophagitis: Secondary | ICD-10-CM | POA: Insufficient documentation

## 2023-10-09 NOTE — Patient Instructions (Incomplete)
Be Involved in Caring For Your Health:  Taking Medications When medications are taken as directed, they can greatly improve your health. But if they are not taken as prescribed, they may not work. In some cases, not taking them correctly can be harmful. To help ensure your treatment remains effective and safe, understand your medications and how to take them. Bring your medications to each visit for review by your provider.  Your lab results, notes, and after visit summary will be available on My Chart. We strongly encourage you to use this feature. If lab results are abnormal the clinic will contact you with the appropriate steps. If the clinic does not contact you assume the results are satisfactory. You can always view your results on My Chart. If you have questions regarding your health or results, please contact the clinic during office hours. You can also ask questions on My Chart.  We at [facility] are grateful that you chose [place] to provide your care. We strive to provide evidence-based and compassionate care and are always looking for feedback. If you get a survey from the clinic please complete this so we can hear your opinions.  Healthy Eating, Adult Healthy eating may help you get and keep a healthy body weight, reduce the risk of chronic disease, and live a long and productive life. It is important to follow a healthy eating pattern. Your nutritional and calorie needs should be met mainly by different nutrient-rich foods. What are tips for following this plan? Reading food labels Read labels and choose the following: Reduced or low sodium products. Juices with 100% fruit juice. Foods with low saturated fats (<3 g per serving) and high polyunsaturated and monounsaturated fats. Foods with whole grains, such as whole wheat, cracked wheat, brown rice, and wild rice. Whole grains that are fortified with folic acid. This is recommended for females who are pregnant or who want to  become pregnant. Read labels and do not eat or drink the following: Foods or drinks with added sugars. These include foods that contain brown sugar, corn sweetener, corn syrup, dextrose, fructose, glucose, high-fructose corn syrup, honey, invert sugar, lactose, malt syrup, maltose, molasses, raw sugar, sucrose, trehalose, or turbinado sugar. Limit your intake of added sugars to less than 10% of your total daily calories. Do not eat more than the following amounts of added sugar per day: 6 teaspoons (25 g) for females. 9 teaspoons (38 g) for males. Foods that contain processed or refined starches and grains. Refined grain products, such as white flour, degermed cornmeal, white bread, and white rice. Shopping Choose nutrient-rich snacks, such as vegetables, whole fruits, and nuts. Avoid high-calorie and high-sugar snacks, such as potato chips, fruit snacks, and candy. Use oil-based dressings and spreads on foods instead of solid fats such as butter, margarine, sour cream, or cream cheese. Limit pre-made sauces, mixes, and "instant" products such as flavored rice, instant noodles, and ready-made pasta. Try more plant-protein sources, such as tofu, tempeh, black beans, edamame, lentils, nuts, and seeds. Explore eating plans such as the Mediterranean diet or vegetarian diet. Try heart-healthy dips made with beans and healthy fats like hummus and guacamole. Vegetables go great with these. Cooking Use oil to saut or stir-fry foods instead of solid fats such as butter, margarine, or lard. Try baking, boiling, grilling, or broiling instead of frying. Remove the fatty part of meats before cooking. Steam vegetables in water or broth. Meal planning  At meals, imagine dividing your plate into fourths: One-half of  your plate is fruits and vegetables. One-fourth of your plate is whole grains. One-fourth of your plate is protein, especially lean meats, poultry, eggs, tofu, beans, or nuts. Include low-fat  dairy as part of your daily diet. Lifestyle Choose healthy options in all settings, including home, work, school, restaurants, or stores. Prepare your food safely: Wash your hands after handling raw meats. Where you prepare food, keep surfaces clean by regularly washing with hot, soapy water. Keep raw meats separate from ready-to-eat foods, such as fruits and vegetables. Cook seafood, meat, poultry, and eggs to the recommended temperature. Get a food thermometer. Store foods at safe temperatures. In general: Keep cold foods at 48F (4.4C) or below. Keep hot foods at 148F (60C) or above. Keep your freezer at [facility] (-17.8C) or below. Foods are not safe to eat if they have been between the temperatures of 40-148F (4.4-60C) for more than 2 hours. What foods should I eat? Fruits Aim to eat 1-2 cups of fresh, canned (in natural juice), or frozen fruits each day. One cup of fruit equals 1 small apple, 1 large banana, 8 large strawberries, 1 cup (237 g) canned fruit,  cup (82 g) dried fruit, or 1 cup (240 mL) 100% juice. Vegetables Aim to eat 2-4 cups of fresh and frozen vegetables each day, including different varieties and colors. One cup of vegetables equals 1 cup (91 g) broccoli or cauliflower florets, 2 medium carrots, 2 cups (150 g) raw, leafy greens, 1 large tomato, 1 large bell pepper, 1 large sweet potato, or 1 medium white potato. Grains Aim to eat 5-10 ounce-equivalents of whole grains each day. Examples of 1 ounce-equivalent of grains include 1 slice of bread, 1 cup (40 g) ready-to-eat cereal, 3 cups (24 g) popcorn, or  cup (93 g) cooked rice. Meats and other proteins Try to eat 5-7 ounce-equivalents of protein each day. Examples of 1 ounce-equivalent of protein include 1 egg,  oz nuts (12 almonds, 24 pistachios, or 7 walnut halves), 1/4 cup (90 g) cooked beans, 6 tablespoons (90 g) hummus or 1 tablespoon (16 g) peanut butter. A cut of meat or fish that is the size of a deck of  cards is about 3-4 ounce-equivalents (85 g). Of the protein you eat each week, try to have at least 8 sounce (227 g) of seafood. This is about 2 servings per week. This includes salmon, trout, herring, sardines, and anchovies. Dairy Aim to eat 3 cup-equivalents of fat-free or low-fat dairy each day. Examples of 1 cup-equivalent of dairy include 1 cup (240 mL) milk, 8 ounces (250 g) yogurt, 1 ounces (44 g) natural cheese, or 1 cup (240 mL) fortified soy milk. Fats and oils Aim for about 5 teaspoons (21 g) of fats and oils per day. Choose monounsaturated fats, such as canola and olive oils, mayonnaise made with olive oil or avocado oil, avocados, peanut butter, and most nuts, or polyunsaturated fats, such as sunflower, corn, and soybean oils, walnuts, pine nuts, sesame seeds, sunflower seeds, and flaxseed. Beverages Aim for 6 eight-ounce glasses of water per day. Limit coffee to 3-5 eight-ounce cups per day. Limit caffeinated beverages that have added calories, such as soda and energy drinks. If you drink alcohol: Limit how much you have to: 0-1 drink a day if you are female. 0-2 drinks a day if you are female. Know how much alcohol is in your drink. In the [place] one drink is one 12 oz bottle of beer (355 mL), one 5 oz glass of wine (  148 mL), or one 1 oz glass of hard liquor (44 mL). Seasoning and other foods Try not to add too much salt to your food. Try using herbs and spices instead of salt. Try not to add sugar to food. This information is based on [place] nutrition guidelines. To learn more, visit [employer]. Exact amounts may vary. You may need different amounts. This information is not intended to replace advice given to you by your health care provider. Make sure you discuss any questions you have with your health care provider. Document Revised: 08/02/2022 Document Reviewed: 08/02/2022 [employer]vier Patient Education  2024 [employer].

## 2023-10-12 ENCOUNTER — Ambulatory Visit (INDEPENDENT_AMBULATORY_CARE_PROVIDER_SITE_OTHER): Payer: Medicare Other | Admitting: Nurse Practitioner

## 2023-10-12 ENCOUNTER — Encounter: Payer: Self-pay | Admitting: Nurse Practitioner

## 2023-10-12 VITALS — BP 122/68 | HR 66 | Ht 65.0 in | Wt 149.2 lb

## 2023-10-12 DIAGNOSIS — F419 Anxiety disorder, unspecified: Secondary | ICD-10-CM | POA: Diagnosis not present

## 2023-10-12 DIAGNOSIS — K200 Eosinophilic esophagitis: Secondary | ICD-10-CM

## 2023-10-12 DIAGNOSIS — E782 Mixed hyperlipidemia: Secondary | ICD-10-CM

## 2023-10-12 DIAGNOSIS — Z79899 Other long term (current) drug therapy: Secondary | ICD-10-CM

## 2023-10-12 DIAGNOSIS — N950 Postmenopausal bleeding: Secondary | ICD-10-CM

## 2023-10-12 DIAGNOSIS — E559 Vitamin D deficiency, unspecified: Secondary | ICD-10-CM

## 2023-10-12 DIAGNOSIS — M85852 Other specified disorders of bone density and structure, left thigh: Secondary | ICD-10-CM | POA: Diagnosis not present

## 2023-10-12 DIAGNOSIS — Z23 Encounter for immunization: Secondary | ICD-10-CM

## 2023-10-12 DIAGNOSIS — Z0000 Encounter for general adult medical examination without abnormal findings: Secondary | ICD-10-CM

## 2023-10-12 MED ORDER — LORAZEPAM 1 MG PO TABS: 1.0000 mg | ORAL_TABLET | Freq: Every evening | ORAL | 2 refills | Status: DC | PRN

## 2023-10-12 NOTE — Assessment & Plan Note (Signed)
Reviewed anxiety plan of care.

## 2023-10-12 NOTE — Assessment & Plan Note (Signed)
Chronic, ongoing.  Recommend Vitamin D3 2000 units daily, check level today.

## 2023-10-12 NOTE — Progress Notes (Signed)
BP 122/68 (BP Location: Left Arm)   Pulse 66   Ht 5\' 5"  (1.651 m)   Wt 149 lb 3.2 oz (67.7 kg)   LMP  (LMP Unknown)   SpO2 97%   BMI 24.83 kg/m    Subjective:    Patient ID: Kristen May, female    DOB: October 24, 1951, 72 y.o.   MRN: [id number]  HPI: Kristen May is a 72 y.o. female presenting on 10/12/2023 for medical examination. Current medical complaints include:none  She currently lives with: husband Menopausal Symptoms: no   Continues on Protonix for eosinophilic esophagitis. Does have some bleeding, smearing, daily from top of pad to lower area.  Does have hemorrhoids.  GI has recommended she go to GYN.   OSTEOPENIA DEXA on 07/27/22 noted ongoing osteopenia T-score -1.5.  Fall last week and hurt tailbone.  If puts weight on it there is pain.  Adequate calcium & vitamin D: yes takes Vitamin D Weight bearing exercises: yes   HYPERLIPIDEMIA Continues diet focus.  Has lost 11 pounds.  Is taking fiber supplement. Hyperlipidemia status: good compliance Satisfied with current treatment?  yes Side effects:  no Medication compliance: good compliance Supplements: none Aspirin:  no The 10-year ASCVD risk score (Arnett DK, et al., 2019) is: 9.8%   Values used to calculate the score:     Age: 68 years     Sex: Female     Is Non-Hispanic African American: No     Diabetic: No     Tobacco smoker: No     Systolic Blood Pressure: 122 mmHg     Is BP treated: No     HDL Cholesterol: 69 mg/dL     Total Cholesterol: 244 mg/dL Chest pain:  no Coronary artery disease:  no Family history CAD:  yes Family history early CAD:  no  DEPRESSION Takes Ativan as needed, has been on for long period -- takes only 1/2 a pill most often.  Helps with performing onstage.   She rarely takes this, gets refills once a year #30 pills and 2 refills.  Pt is aware of risks of benzo medication use to include increased sedation, respiratory suppression, falls, dependence and cardiovascular events. Pt would like to  continue treatment as benefit determined to outweigh risk.  Last filled 06/13/23. Mood status: stable Satisfied with current treatment?: yes Symptom severity: mild  Duration of current treatment : chronic Side effects: no Medication compliance: good compliance Psychotherapy/counseling: none Depressed mood: no Anxious mood: no Anhedonia: no Significant weight loss or gain: no Insomnia: no Fatigue: no Feelings of worthlessness or guilt: no Impaired concentration/indecisiveness: no Suicidal ideations: no Hopelessness: no Crying spells: no    10/12/2023    2:01 PM 08/30/2023    3:58 PM 03/10/2023   11:16 AM 09/08/2022    1:18 PM 08/05/2022   11:44 AM  Depression screen PHQ 2/9  Decreased Interest 0 0 0 0 0  Down, Depressed, Hopeless 0 0 0 0 0  PHQ - 2 Score 0 0 0 0 0  Altered sleeping 0 0 0 0 0  Tired, decreased energy 0 0 0 0 0  Change in appetite 0 0 0 0 0  Feeling bad or failure about yourself  0 0 0 0 0  Trouble concentrating 0 0 0 0 0  Moving slowly or fidgety/restless 0 0 0 0 0  Suicidal thoughts 0 0 0 0 0  PHQ-9 Score 0 0 0 0 0  Difficult doing work/chores Not difficult at all Not  difficult at all Not difficult at all  Not difficult at all      10/12/2023    2:02 PM 03/10/2023   11:16 AM 09/08/2022    1:18 PM 07/09/2022   11:15 AM  GAD 7 : Generalized Anxiety Score  Nervous, Anxious, on Edge 0 0 0 0  Control/stop worrying 0 0 0 0  Worry too much - different things 0 0 0 0  Trouble relaxing 0 0 0 0  Restless 0 0 0 0  Easily annoyed or irritable 0 0 0 0  Afraid - awful might happen 0 0 0 0  Total GAD 7 Score 0 0 0 0  Anxiety Difficulty Not difficult at all Not difficult at all Not difficult at all Not difficult at all       10/12/2023    2:01 PM 08/30/2023    4:01 PM 03/10/2023   11:16 AM 09/08/2022    1:24 PM 09/08/2022    1:17 PM  Fall Risk   Falls in the past year? 1 0 0 0 0  Number falls in past yr: 0 0 0 0 0  Injury with Fall? 0 0 0 0 0  Risk for  fall due to : History of fall(s) No Fall Risks No Fall Risks No Fall Risks No Fall Risks  Follow up Falls evaluation completed Falls prevention discussed Falls evaluation completed Education provided Falls evaluation completed    Functional Status Survey: Is the patient deaf or have difficulty hearing?: No Does the patient have difficulty seeing, even when wearing glasses/contacts?: No Does the patient have difficulty concentrating, remembering, or making decisions?: No Does the patient have difficulty walking or climbing stairs?: No Does the patient have difficulty dressing or bathing?: No Does the patient have difficulty doing errands alone such as visiting a doctor's office or shopping?: No   Past Medical History:  Past Medical History:  Diagnosis Date   Anxiety    Back pain    Cervicalgia    Depression    Elevated vitamin B12 level    IBS (irritable bowel syndrome)    Insomnia    Lumbago    Osteopenia     Surgical History:  Past Surgical History:  Procedure Laterality Date   CHOLECYSTECTOMY     CHOLESTEATOMA EXCISION     eye surgerry     SKIN GRAFT      Medications:  Current Outpatient Medications on File Prior to Visit  Medication Sig   Cholecalciferol 25 MCG (1000 UT) tablet Take 2,000 Units by mouth daily.   GLYCINE PO Take 5 mLs by mouth daily.   loperamide (IMODIUM) 2 MG capsule Take 2 mg by mouth 3 (three) times daily as needed for diarrhea or loose stools.   pantoprazole (PROTONIX) 40 MG tablet Take 40 mg by mouth 2 (two) times daily.   No current facility-administered medications on file prior to visit.    Allergies:  Allergies  Allergen Reactions   Morphine And Codeine Itching and Nausea And Vomiting   Lactose Other (See Comments)    Gas and bloating per pt.    Social History:  Social History   Socioeconomic History   Marital status: Widowed    Spouse name: Not on file   Number of children: 4   Years of education: Not on file   Highest  education level: Not on file  Occupational History   Occupation: retired  Tobacco Use   Smoking status: Former    Current packs/day: 0.00  Average packs/day: 3.0 packs/day for 7.0 years (21.0 ttl pk-yrs)    Types: Cigarettes    Start date: 34    Quit date: 78    Years since quitting: 44.9   Smokeless tobacco: Never  Vaping Use   Vaping status: Never Used  Substance and Sexual Activity   Alcohol use: Yes    Alcohol/week: 6.0 standard drinks of alcohol    Types: 6 Standard drinks or equivalent per week    Comment: 3 drinks, 2 days on the weekends   Drug use: No   Sexual activity: Yes  Other Topics Concern   Not on file  Social History Narrative   Not on file   Social Determinants of Health   Financial Resource Strain: Low Risk  (08/30/2023)   Overall Financial Resource Strain (CARDIA)    Difficulty of Paying Living Expenses: Not hard at all  Food Insecurity: No Food Insecurity (08/30/2023)   Hunger Vital Sign    Worried About Running Out of Food in the Last Year: Never true    Ran Out of Food in the Last Year: Never true  Transportation Needs: No Transportation Needs (08/30/2023)   PRAPARE - Administrator, Civil Service (Medical): No    Lack of Transportation (Non-Medical): No  Physical Activity: Sufficiently Active (08/30/2023)   Exercise Vital Sign    Days of Exercise per Week: 7 days    Minutes of Exercise per Session: 60 min  Stress: No Stress Concern Present (08/30/2023)   [employer] of Occupational Health - Occupational Stress Questionnaire    Feeling of Stress : Only a little  Social Connections: Socially Isolated (08/30/2023)   Social Connection and Isolation Panel [NHANES]    Frequency of Communication with Friends and Family: Never    Frequency of Social Gatherings with Friends and Family: More than three times a week    Attends Religious Services: Never    Database administrator or Organizations: No    Attends Banker  Meetings: Never    Marital Status: Widowed  Intimate Partner Violence: Not At Risk (08/30/2023)   Humiliation, Afraid, Rape, and Kick questionnaire    Fear of Current or Ex-Partner: No    Emotionally Abused: No    Physically Abused: No    Sexually Abused: No   Social History   Tobacco Use  Smoking Status Former   Current packs/day: 0.00   Average packs/day: 3.0 packs/day for 7.0 years (21.0 ttl pk-yrs)   Types: Cigarettes   Start date: 14   Quit date: 1980   Years since quitting: 44.9  Smokeless Tobacco Never   Social History   Substance and Sexual Activity  Alcohol Use Yes   Alcohol/week: 6.0 standard drinks of alcohol   Types: 6 Standard drinks or equivalent per week   Comment: 3 drinks, 2 days on the weekends    Family History:  Family History  Problem Relation Age of Onset   Cancer Mother        stomach   COPD Mother    Heart disease Father    Cancer Father        colon   Diabetes Father    Polycystic ovary syndrome Daughter    Cancer Paternal Grandfather        rectal   Depression Daughter    Bipolar disorder Daughter     Past medical history, surgical history, medications, allergies, family history and social history reviewed with patient today and changes made to appropriate  areas of the chart.   Review of Systems - negative All other ROS negative except what is listed above and in the HPI.      Objective:    BP 122/68 (BP Location: Left Arm)   Pulse 66   Ht 5\' 5"  (1.651 m)   Wt 149 lb 3.2 oz (67.7 kg)   LMP  (LMP Unknown)   SpO2 97%   BMI 24.83 kg/m   Wt Readings from Last 3 Encounters:  10/12/23 149 lb 3.2 oz (67.7 kg)  08/30/23 153 lb (69.4 kg)  04/01/23 160 lb 3.2 oz (72.7 kg)    Physical Exam Vitals and nursing note reviewed. Exam conducted with a chaperone present.  Constitutional:      General: She is awake. She is not in acute distress.    Appearance: She is well-developed and well-groomed. She is not ill-appearing or  toxic-appearing.  HENT:     Head: Normocephalic and atraumatic.     Right Ear: Hearing, tympanic membrane, ear canal and external ear normal. No drainage.     Left Ear: Hearing, tympanic membrane, ear canal and external ear normal. No drainage.     Nose: Nose normal.     Right Sinus: No maxillary sinus tenderness or frontal sinus tenderness.     Left Sinus: No maxillary sinus tenderness or frontal sinus tenderness.     Mouth/Throat:     Mouth: Mucous membranes are moist.     Pharynx: Oropharynx is clear. Uvula midline. No pharyngeal swelling, oropharyngeal exudate or posterior oropharyngeal erythema.  Eyes:     General: Lids are normal.        Right eye: No discharge.        Left eye: No discharge.     Extraocular Movements: Extraocular movements intact.     Conjunctiva/sclera: Conjunctivae normal.     Pupils: Pupils are equal, round, and reactive to light.     Visual Fields: Right eye visual fields normal and left eye visual fields normal.  Neck:     Thyroid: No thyromegaly.     Vascular: No carotid bruit.     Trachea: Trachea normal.  Cardiovascular:     Rate and Rhythm: Normal rate and regular rhythm.     Heart sounds: Normal heart sounds. No murmur heard.    No gallop.  Pulmonary:     Effort: Pulmonary effort is normal. No accessory muscle usage or respiratory distress.     Breath sounds: Normal breath sounds.  Chest:  Breasts:    Right: Normal.     Left: Normal.  Abdominal:     General: Bowel sounds are normal.     Palpations: Abdomen is soft. There is no hepatomegaly or splenomegaly.     Tenderness: There is no abdominal tenderness.  Musculoskeletal:        General: Normal range of motion.     Cervical back: Normal range of motion and neck supple.     Right lower leg: No edema.     Left lower leg: No edema.  Lymphadenopathy:     Head:     Right side of head: No submental, submandibular, tonsillar, preauricular or posterior auricular adenopathy.     Left side of  head: No submental, submandibular, tonsillar, preauricular or posterior auricular adenopathy.     Cervical: No cervical adenopathy.     Upper Body:     Right upper body: No supraclavicular, axillary or pectoral adenopathy.     Left upper body: No supraclavicular, axillary or  pectoral adenopathy.  Skin:    General: Skin is warm and dry.     Capillary Refill: Capillary refill takes less than 2 seconds.     Findings: No rash.  Neurological:     Mental Status: She is alert and oriented to person, place, and time.     Gait: Gait is intact.     Deep Tendon Reflexes: Reflexes are normal and symmetric.     Reflex Scores:      Brachioradialis reflexes are 2+ on the right side and 2+ on the left side.      Patellar reflexes are 2+ on the right side and 2+ on the left side. Psychiatric:        Attention and Perception: Attention normal.        Mood and Affect: Mood normal.        Speech: Speech normal.        Behavior: Behavior normal. Behavior is cooperative.        Thought Content: Thought content normal.        Judgment: Judgment normal.    Results for orders placed or performed in visit on 03/10/23  Comprehensive metabolic panel  Result Value Ref Range   Glucose 83 70 - 99 mg/dL   BUN 15 8 - 27 mg/dL   Creatinine, Ser 9.56 0.57 - 1.00 mg/dL   eGFR 69 >38 VF/IEP/3.29   BUN/Creatinine Ratio 17 12 - 28   Sodium 142 134 - 144 mmol/L   Potassium 4.5 3.5 - 5.2 mmol/L   Chloride 105 96 - 106 mmol/L   CO2 24 20 - 29 mmol/L   Calcium 9.9 8.7 - 10.3 mg/dL   Total Protein 6.6 6.0 - 8.5 g/dL   Albumin 4.5 3.9 - 4.9 g/dL   Globulin, Total 2.1 1.5 - 4.5 g/dL   Albumin/Globulin Ratio 2.1 1.2 - 2.2   Bilirubin Total 0.4 0.0 - 1.2 mg/dL   Alkaline Phosphatase 73 44 - 121 IU/L   AST 20 0 - 40 IU/L   ALT 31 0 - 32 IU/L  Lipid Panel w/o Chol/HDL Ratio  Result Value Ref Range   Cholesterol, Total 244 (H) 100 - 199 mg/dL   Triglycerides 84 0 - 149 mg/dL   HDL 69 >51 mg/dL   VLDL Cholesterol Cal  14 5 - 40 mg/dL   LDL Chol Calc (NIH) 884 (H) 0 - 99 mg/dL      Assessment & Plan:   Problem List Items Addressed This Visit       Digestive   Eosinophilic esophagitis    Chronic, ongoing.  Followed by GI, continue this collaboration.  Recent notes reviewed.  Risks of PPI use were discussed with patient including bone loss, C. Diff diarrhea, pneumonia, infections, CKD, electrolyte abnormalities.  Verbalizes understanding and chooses to continue the medication.       Relevant Orders   CBC with Differential/Platelet   Magnesium     Musculoskeletal and Integument   Osteopenia of neck of left femur    Chronic, ongoing.  Recommend continue taking Vitamin D 2000 units at home and continue daily calcium.  Check Vit D and CMP today.      Relevant Orders   VITAMIN D 25 Hydroxy (Vit-D Deficiency, Fractures)     Other   Anxiety    Chronic, stable with minimal use of Ativan.  #30 pills lasts 6 + months most often on PDMP review and discussion with patient.  She is aware of risks of benzo use  chronically.  UDS due next 10/11/24, obtain contract next visit.  Denies SI/HI.      Relevant Medications   LORazepam (ATIVAN) 1 MG tablet   Other Relevant Orders   TSH   [id number] 11+Oxyco+Alc+Crt-Bund   Hyperlipidemia - Primary    Currently diet controlled with recent ASCVD 9.8%.  She prefers not to take medication, but discussed with her numbers are at level for medication.  Also discussed obtaining Calcium Scoring.  Continue diet focus and exercise -- recheck lipid panel today.       Relevant Orders   Comprehensive metabolic panel   Lipid Panel w/o Chol/HDL Ratio   Long-term current use of benzodiazepine    Reviewed anxiety plan of care.      Relevant Orders   [id number] 11+Oxyco+Alc+Crt-Bund   Vitamin D deficiency    Chronic, ongoing.  Recommend Vitamin D3 2000 units daily, check level today.      Relevant Orders   VITAMIN D 25 Hydroxy (Vit-D Deficiency, Fractures)   Other Visit  Diagnoses     Abnormal vaginal bleeding in postmenopausal patient       Unsure if from hemorrhoids or vaginal area.  GI wants her to visit with GYN.  Referral in.   Relevant Orders   Ambulatory referral to Gynecology   High risk medication use       UDS today due to long term benzo use.   Relevant Orders   [id number] 11+Oxyco+Alc+Crt-Bund   Encounter for annual physical exam       Annual physical today with labs and health maintenance reviewed, discussed with patient.        Follow up plan: Return in about 6 months (around 04/10/2024) for ANXIETY.   LABORATORY TESTING:  - Pap smear: not applicable  IMMUNIZATIONS:   - Tdap: Tetanus vaccination status reviewed: last tetanus booster within 10 years. - Influenza: Up to date - Pneumovax: Up to date - Prevnar: Up to date - HPV: Not applicable - Zostavax vaccine: Up To Date  SCREENING: -Mammogram: Wishes not to continue, have been discoontinued - Colonoscopy: Up to date  - Bone Density: Up to date  -Hearing Test: Not applicable  -Spirometry: Not applicable   PATIENT COUNSELING:   Advised to take 1 mg of folate supplement per day if capable of pregnancy.   Sexuality: Discussed sexually transmitted diseases, partner selection, use of condoms, avoidance of unintended pregnancy  and contraceptive alternatives.   Advised to avoid cigarette smoking.  I discussed with the patient that most people either abstain from alcohol or drink within safe limits (<=14/week and <=4 drinks/occasion for males, <=7/weeks and <= 3 drinks/occasion for females) and that the risk for alcohol disorders and other health effects rises proportionally with the number of drinks per week and how often a drinker exceeds daily limits.  Discussed cessation/primary prevention of drug use and availability of treatment for abuse.   Diet: Encouraged to adjust caloric intake to maintain  or achieve ideal body weight, to reduce intake of dietary saturated fat and total  fat, to limit sodium intake by avoiding high sodium foods and not adding table salt, and to maintain adequate dietary potassium and calcium preferably from fresh fruits, vegetables, and low-fat dairy products.    Stressed the importance of regular exercise  Injury prevention: Discussed safety belts, safety helmets, smoke detector, smoking near bedding or upholstery.   Dental health: Discussed importance of regular tooth brushing, flossing, and dental visits.    NEXT PREVENTATIVE PHYSICAL DUE IN 1 YEAR. Return  in about 6 months (around 04/10/2024) for ANXIETY.

## 2023-10-12 NOTE — Assessment & Plan Note (Signed)
Chronic, ongoing.  Followed by GI, continue this collaboration.  Recent notes reviewed.  Risks of PPI use were discussed with patient including bone loss, C. Diff diarrhea, pneumonia, infections, CKD, electrolyte abnormalities.  Verbalizes understanding and chooses to continue the medication.

## 2023-10-12 NOTE — Assessment & Plan Note (Signed)
Currently diet controlled with recent ASCVD 9.8%.  She prefers not to take medication, but discussed with her numbers are at level for medication.  Also discussed obtaining Calcium Scoring.  Continue diet focus and exercise -- recheck lipid panel today.

## 2023-10-12 NOTE — Assessment & Plan Note (Signed)
Chronic, ongoing.  Recommend continue taking Vitamin D 2000 units at home and continue daily calcium.  Check Vit D and CMP today.

## 2023-10-12 NOTE — Assessment & Plan Note (Signed)
Chronic, stable with minimal use of Ativan.  #30 pills lasts 6 + months most often on PDMP review and discussion with patient.  She is aware of risks of benzo use chronically.  UDS due next 10/11/24, obtain contract next visit.  Denies SI/HI.

## 2023-10-13 LAB — CBC WITH DIFFERENTIAL/PLATELET
Basophils Absolute: 0.1 10*3/uL (ref 0.0–0.2)
Basos: 1 %
EOS (ABSOLUTE): 0.2 10*3/uL (ref 0.0–0.4)
Eos: 3 %
Hematocrit: 40 % (ref 34.0–46.6)
Hemoglobin: 13.5 g/dL (ref 11.1–15.9)
Immature Grans (Abs): 0 10*3/uL (ref 0.0–0.1)
Immature Granulocytes: 0 %
Lymphocytes Absolute: 2.6 10*3/uL (ref 0.7–3.1)
Lymphs: 37 %
MCH: 32.6 pg (ref 26.6–33.0)
MCHC: 33.8 g/dL (ref 31.5–35.7)
MCV: 97 fL (ref 79–97)
Monocytes Absolute: 0.5 10*3/uL (ref 0.1–0.9)
Monocytes: 8 %
Neutrophils Absolute: 3.6 10*3/uL (ref 1.4–7.0)
Neutrophils: 51 %
Platelets: 305 10*3/uL (ref 150–450)
RBC: 4.14 x10E6/uL (ref 3.77–5.28)
RDW: 12.1 % (ref 11.7–15.4)
WBC: 6.9 10*3/uL (ref 3.4–10.8)

## 2023-10-13 LAB — COMPREHENSIVE METABOLIC PANEL
ALT: 17 [IU]/L (ref 0–32)
AST: 15 [IU]/L (ref 0–40)
Albumin: 4.3 g/dL (ref 3.8–4.8)
Alkaline Phosphatase: 81 [IU]/L (ref 44–121)
BUN/Creatinine Ratio: 21 (ref 12–28)
BUN: 18 mg/dL (ref 8–27)
Bilirubin Total: 0.6 mg/dL (ref 0.0–1.2)
CO2: 24 mmol/L (ref 20–29)
Calcium: 9.6 mg/dL (ref 8.7–10.3)
Chloride: 101 mmol/L (ref 96–106)
Creatinine, Ser: 0.84 mg/dL (ref 0.57–1.00)
Globulin, Total: 2.4 g/dL (ref 1.5–4.5)
Glucose: 86 mg/dL (ref 70–99)
Potassium: 4 mmol/L (ref 3.5–5.2)
Sodium: 139 mmol/L (ref 134–144)
Total Protein: 6.7 g/dL (ref 6.0–8.5)
eGFR: 74 mL/min/{1.73_m2} (ref >59)

## 2023-10-13 LAB — LIPID PANEL W/O CHOL/HDL RATIO
Cholesterol, Total: 221 mg/dL — ABNORMAL HIGH (ref 100–199)
HDL: 74 mg/dL (ref >39)
LDL Chol Calc (NIH): 133 mg/dL — ABNORMAL HIGH (ref 0–99)
Triglycerides: 82 mg/dL (ref 0–149)
VLDL Cholesterol Cal: 14 mg/dL (ref 5–40)

## 2023-10-13 LAB — MAGNESIUM: Magnesium: 2.3 mg/dL (ref 1.6–2.3)

## 2023-10-13 LAB — TSH: TSH: 0.825 u[IU]/mL (ref 0.450–4.500)

## 2023-10-13 LAB — VITAMIN D 25 HYDROXY (VIT D DEFICIENCY, FRACTURES): Vit D, 25-Hydroxy: 72.6 ng/mL (ref 30.0–100.0)

## 2023-10-14 LAB — DRUG SCREEN 764883 11+OXYCO+ALC+CRT-BUND
Amphetamines, Urine: NEGATIVE ng/mL
BENZODIAZ UR QL: NEGATIVE ng/mL
Barbiturate: NEGATIVE ng/mL
Cannabinoid Quant, Ur: NEGATIVE ng/mL
Cocaine (Metabolite): NEGATIVE ng/mL
Creatinine: 49.4 mg/dL (ref 20.0–300.0)
Ethanol: NEGATIVE %
Meperidine: NEGATIVE ng/mL
Methadone Screen, Urine: NEGATIVE ng/mL
OPIATE SCREEN URINE: NEGATIVE ng/mL
Oxycodone/Oxymorphone, Urine: NEGATIVE ng/mL
Phencyclidine: NEGATIVE ng/mL
Propoxyphene: NEGATIVE ng/mL
Tramadol: NEGATIVE ng/mL
pH, Urine: 5.6 (ref 4.5–8.9)

## 2023-10-15 NOTE — Progress Notes (Signed)
The 10-year ASCVD risk score (Arnett DK, et al., 2019) is: 9.5%   Values used to calculate the score:     Age: 72 years     Sex: Female     Is Non-Hispanic African American: No     Diabetic: No     Tobacco smoker: No     Systolic Blood Pressure: 122 mmHg     Is BP treated: No     HDL Cholesterol: 74 mg/dL     Total Cholesterol: 221 mg/dL  Good morning Kristen May, your labs have returned and overall are stable with exception of ongoing elevation in total cholesterol and LDL (bad cholesterol), based on risk score calculated you are recommended to take statin therapy.  Do you want to start this?  Let me know.  Any questions? Keep being awesome!!  Thank you for allowing me to participate in your care.  I appreciate you. Kindest regards, Sharetha Newson

## 2023-11-11 ENCOUNTER — Encounter: Payer: Medicare Other | Admitting: Certified Nurse Midwife

## 2023-11-24 ENCOUNTER — Ambulatory Visit (INDEPENDENT_AMBULATORY_CARE_PROVIDER_SITE_OTHER): Payer: Medicare Other | Admitting: Obstetrics & Gynecology

## 2023-11-24 VITALS — BP 147/81 | HR 61 | Ht 65.0 in | Wt 154.1 lb

## 2023-11-24 DIAGNOSIS — N950 Postmenopausal bleeding: Secondary | ICD-10-CM | POA: Diagnosis not present

## 2023-11-24 DIAGNOSIS — Z7689 Persons encountering health services in other specified circumstances: Secondary | ICD-10-CM

## 2023-11-24 NOTE — Progress Notes (Signed)
    GYNECOLOGY PROGRESS NOTE  Subjective:    Patient ID: Kristen May, female    DOB: [birth year], 73 y.o.   MRN: [id number]  HPI  Patient is a 73 y.o. widowed P0 here as a new patient with the issue of a 2 year h/o seeing a brown discharge in her panties. She has been abstinent for at least 10 years although she does have a long term boyfriend.  The following portions of the patient's history were reviewed and updated as appropriate: allergies, current medications, past family history, past medical history, past social history, past surgical history, and problem list.  Review of Systems Pertinent items are noted in HPI.  She denies a h/o abnormal pap smears. She reports an up to date colonoscopy.  Objective:   Blood pressure (!) 147/81, pulse 61, height 5' 5 (1.651 m), weight 154 lb 1.6 oz (69.9 kg). Body mass index is 25.64 kg/m. Well nourished, well hydrated White female, no apparent distress She is ambulating and conversing normally. EG- marked appropriated VVA Speculum exam reveals a moderate amount of vaginal laxity The cervical opening is posterior. The cervix is atrophic but otherwise normal. Bimanual exam reveals a small retroverted uterus, non-palpable adnexa and no masses or tenderness.  I inspected her panties and the stain I see seems c/w old watery blood (very slight stain)   Assessment:   1. Establishing care with new doctor, encounter for   2. PMB (postmenopausal bleeding)      Plan:   1. Establishing care with new doctor, encounter for (Primary)   2. PMB (postmenopausal bleeding)  - US  PELVIC COMPLETE WITH TRANSVAGINAL  She will come back after ultrasound results are available.

## 2023-12-22 ENCOUNTER — Other Ambulatory Visit: Payer: Medicare Other

## 2023-12-26 ENCOUNTER — Encounter: Payer: Self-pay | Admitting: Obstetrics

## 2023-12-27 NOTE — Telephone Encounter (Signed)
Contacted the patient via phone x2. Left message advising the patient to call the office due to scheduling change.

## 2023-12-27 NOTE — Telephone Encounter (Signed)
Contacted the patient via phone. Asking the patient if she would like to rescheduled. She expressed "she would rather be seen then postpone". The patient is ware she would need to be in stirrups for the ultrasound per Victorino Dike P do to the angle needed for this scan. The patient states " will put on the extra socks, to give her some extra padding". The patient has confirmed she will come to ultrasound and follow up on 2/12.

## 2023-12-28 ENCOUNTER — Other Ambulatory Visit: Payer: Medicare Other

## 2023-12-28 ENCOUNTER — Ambulatory Visit: Payer: Medicare Other | Admitting: Obstetrics

## 2023-12-28 NOTE — Telephone Encounter (Signed)
Contacted the patient via phone and rescheduled for 3/6 for ultrasound and follow up

## 2024-01-18 ENCOUNTER — Encounter: Payer: Self-pay | Admitting: Nurse Practitioner

## 2024-01-18 DIAGNOSIS — Z780 Asymptomatic menopausal state: Secondary | ICD-10-CM

## 2024-01-19 ENCOUNTER — Ambulatory Visit: Payer: Medicare Other | Admitting: Obstetrics

## 2024-01-19 ENCOUNTER — Other Ambulatory Visit: Payer: Medicare Other

## 2024-04-08 NOTE — Patient Instructions (Signed)
 Be Involved in Caring For Your Health:  Taking Medications When medications are taken as directed, they can greatly improve your health. But if they are not taken as prescribed, they may not work. In some cases, not taking them correctly can be harmful. To help ensure your treatment remains effective and safe, understand your medications and how to take them. Bring your medications to each visit for review by your provider.  Your lab results, notes, and after visit summary will be available on My Chart. We strongly encourage you to use this feature. If lab results are abnormal the clinic will contact you with the appropriate steps. If the clinic does not contact you assume the results are satisfactory. You can always view your results on My Chart. If you have questions regarding your health or results, please contact the clinic during office hours. You can also ask questions on My Chart.  We at [facility] are grateful that you chose [place] to provide your care. We strive to provide evidence-based and compassionate care and are always looking for feedback. If you get a survey from the clinic please complete this so we can hear your opinions.  Managing Anxiety, Adult After being diagnosed with anxiety, you may be relieved to know why you have felt or behaved a certain way. You may also feel overwhelmed about the treatment ahead and what it will mean for your life. With care and support, you can manage your anxiety. How to manage lifestyle changes Understanding the difference between stress and anxiety Although stress can play a role in anxiety, it is not the same as anxiety. Stress is your body's reaction to life changes and events, both good and bad. Stress is often caused by something external, such as a deadline, test, or competition. It normally goes away after the event has ended and will last just a few hours. But, stress can be ongoing and can lead to more than just stress. Anxiety is  caused by something internal, such as imagining a terrible outcome or worrying that something will go wrong that will greatly upset you. Anxiety often does not go away even after the event is over, and it can become a long-term (chronic) worry. Lowering stress and anxiety Talk with your health care provider or a counselor to learn more about lowering anxiety and stress. They may suggest tension-reduction techniques, such as: Music. Spend time creating or listening to music that you enjoy and that inspires you. Mindfulness-based meditation. Practice being aware of your normal breaths while not trying to control your breathing. It can be done while sitting or walking. Centering prayer. Focus on a word, phrase, or sacred image that means something to you and brings you peace. Deep breathing. Expand your stomach and inhale slowly through your nose. Hold your breath for 3-5 seconds. Then breathe out slowly, letting your stomach muscles relax. Self-talk. Learn to notice and spot thought patterns that lead to anxiety reactions. Change those patterns to thoughts that feel peaceful. Muscle relaxation. Take time to tense muscles and then relax them. Choose a tension-reduction technique that fits your lifestyle and personality. These techniques take time and practice. Set aside 5-15 minutes a day to do them. Specialized therapists can offer counseling and training in these techniques. The training to help with anxiety may be covered by some insurance plans. Other things you can do to manage stress and anxiety include: Keeping a stress diary. This can help you learn what triggers your reaction and then learn ways  to manage your response. Thinking about how you react to certain situations. You may not be able to control everything, but you can control your response. Making time for activities that help you relax and not feeling guilty about spending your time in this way. Doing visual imagery. This involves  imagining or creating mental pictures to help you relax. Practicing yoga. Through yoga poses, you can lower tension and relax.  Medicines Medicines for anxiety include: Antidepressant medicines. These are usually prescribed for long-term daily control. Anti-anxiety medicines. These may be added in severe cases, especially when panic attacks occur. When used together, medicines, psychotherapy, and tension-reduction techniques may be the most effective treatment. Relationships Relationships can play a big part in helping you recover. Spend more time connecting with trusted friends and family members. Think about going to couples counseling if you have a partner, taking family education classes, or going to family therapy. Therapy can help you and others better understand your anxiety. How to recognize changes in your anxiety Everyone responds differently to treatment for anxiety. Recovery from anxiety happens when symptoms lessen and stop interfering with your daily life at home or work. This may mean that you will start to: Have better concentration and focus. Worry will interfere less in your daily thinking. Sleep better. Be less irritable. Have more energy. Have improved memory. Try to recognize when your condition is getting worse. Contact your provider if your symptoms interfere with home or work and you feel like your condition is not improving. Follow these instructions at home: Activity Exercise. Adults should: Exercise for at least 150 minutes each week. The exercise should increase your heart rate and make you sweat (moderate-intensity exercise). Do strengthening exercises at least twice a week. Get the right amount and quality of sleep. Most adults need 7-9 hours of sleep each night. Lifestyle  Eat a healthy diet that includes plenty of vegetables, fruits, whole grains, low-fat dairy products, and lean protein. Do not eat a lot of foods that are high in fats, added sugars, or salt  (sodium). Make choices that simplify your life. Do not use any products that contain nicotine or tobacco. These products include cigarettes, chewing tobacco, and vaping devices, such as e-cigarettes. If you need help quitting, ask your provider. Avoid caffeine, alcohol, and certain over-the-counter cold medicines. These may make you feel worse. Ask your pharmacist which medicines to avoid. General instructions Take over-the-counter and prescription medicines only as told by your provider. Keep all follow-up visits. This is to make sure you are managing your anxiety well or if you need more support. Where to find support You can get help and support from: Self-help groups. Online and [employer]. A trusted spiritual leader. Couples counseling. Family education classes. Family therapy. Where to find more information You may find that joining a support group helps you deal with your anxiety. The following sources can help you find counselors or support groups near you: Mental Health America: mentalhealthamerica.net Anxiety and Depression Association of [place] (ADAA): adaa.org The [employer] on Mental Illness (NAMI): nami.org Contact a health care provider if: You have a hard time staying focused or finishing tasks. You spend many hours a day feeling worried about everyday life. You are very tired because you cannot stop worrying. You start to have headaches or often feel tense. You have chronic nausea or diarrhea. Get help right away if: Your heart feels like it is racing. You have shortness of breath. You have thoughts of hurting yourself or others. Get help  right away if you feel like you may hurt yourself or others, or have thoughts about taking your own life. Go to your nearest emergency room or: Call 911. Call the National Suicide Prevention Lifeline at 765-482-1593 or 988. This is open 24 hours a day. Text the Crisis Text Line at 504 124 9896. This information is not  intended to replace advice given to you by your health care provider. Make sure you discuss any questions you have with your health care provider. Document Revised: 08/10/2022 Document Reviewed: 02/22/2021 Elsevier Patient Education  2024 [employer].

## 2024-04-11 ENCOUNTER — Ambulatory Visit (INDEPENDENT_AMBULATORY_CARE_PROVIDER_SITE_OTHER): Payer: Self-pay | Admitting: Nurse Practitioner

## 2024-04-11 ENCOUNTER — Encounter: Payer: Self-pay | Admitting: Nurse Practitioner

## 2024-04-11 VITALS — BP 122/79 | HR 61 | Temp 98.2°F | Ht 65.0 in | Wt 151.2 lb

## 2024-04-11 DIAGNOSIS — E782 Mixed hyperlipidemia: Secondary | ICD-10-CM

## 2024-04-11 DIAGNOSIS — N950 Postmenopausal bleeding: Secondary | ICD-10-CM | POA: Diagnosis not present

## 2024-04-11 DIAGNOSIS — R300 Dysuria: Secondary | ICD-10-CM | POA: Diagnosis not present

## 2024-04-11 DIAGNOSIS — F419 Anxiety disorder, unspecified: Secondary | ICD-10-CM

## 2024-04-11 DIAGNOSIS — Z79899 Other long term (current) drug therapy: Secondary | ICD-10-CM

## 2024-04-11 LAB — URINALYSIS, ROUTINE W REFLEX MICROSCOPIC
Bilirubin, UA: NEGATIVE
Glucose, UA: NEGATIVE
Ketones, UA: NEGATIVE
Leukocytes,UA: NEGATIVE
Nitrite, UA: NEGATIVE
Protein,UA: NEGATIVE
RBC, UA: NEGATIVE
Specific Gravity, UA: 1.02 (ref 1.005–1.030)
Urobilinogen, Ur: 0.2 mg/dL (ref 0.2–1.0)
pH, UA: 6 (ref 5.0–7.5)

## 2024-04-11 LAB — WET PREP FOR TRICH, YEAST, CLUE
Clue Cell Exam: NEGATIVE
Trichomonas Exam: NEGATIVE
Yeast Exam: NEGATIVE

## 2024-04-11 NOTE — Assessment & Plan Note (Signed)
 Chronic, stable with minimal use of Ativan .  #30 pills lasts 6 + months most often on PDMP review and discussion with patient.  Up tp date on refills.  She is aware of risks of benzo use chronically.  UDS due next 10/11/24, obtain contract next visit.  Denies SI/HI.

## 2024-04-11 NOTE — Assessment & Plan Note (Signed)
 Reviewed anxiety plan of care.

## 2024-04-11 NOTE — Assessment & Plan Note (Signed)
 Currently diet controlled with recent ASCVD 9.5%.  She prefers not to take medication, but discussed with her numbers are at level for medication.  Also discussed obtaining Calcium Scoring.  Continue diet focus and exercise -- recheck lipid panel at physical.

## 2024-04-11 NOTE — Assessment & Plan Note (Signed)
 Overall reassuring work-up with gynecology.  Reviewed recent notes and imaging.  Continue this collaboration.  UA and wet prep overall reassuring today with negative findings.

## 2024-04-11 NOTE — Progress Notes (Signed)
 BP 122/79   Pulse 61   Temp 98.2 F (36.8 C) (Oral)   Ht 5\' 5"  (1.651 m)   Wt 151 lb 3.2 oz (68.6 kg)   LMP  (LMP Unknown)   SpO2 98%   BMI 25.16 kg/m    Subjective:    Patient ID: Kristen May, female    DOB: 28-Mar-1951, 73 y.o.   MRN: [id number]  HPI: Kristen May is a 73 y.o. female  Chief Complaint  Patient presents with   Anxiety   Following with GYN for postmenopausal bleeding, last visit was on 03/29/24.  She would like wet prep and urine checked today.  Ultrasound with suggestion of myometrial cysts.  Continues to have discharge issues.  HYPERLIPIDEMIA No medications at this time. Hyperlipidemia status: good compliance Supplements: none Aspirin:  no The 10-year ASCVD risk score (Arnett DK, et al., 2019) is: 9.5%   Values used to calculate the score:     Age: 63 years     Sex: Female     Is Non-Hispanic African American: No     Diabetic: No     Tobacco smoker: No     Systolic Blood Pressure: 122 mmHg     Is BP treated: No     HDL Cholesterol: 74 mg/dL     Total Cholesterol: 221 mg/dL Chest pain:  no Coronary artery disease:  no Family history CAD:  yes - father had heart issues Family history early CAD:  no  ANXIETY/STRESS Continues Ativan  as needed, has been on for years started by previous PCP.  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  She rarely takes this, gets refills once a year #30 pills.  Pt is aware of risks of benzo medication use to include increased sedation, respiratory suppression, falls, dependence and cardiovascular events. Pt would like to continue treatment as benefit determined to outweigh risk.  Last filled 02/22/24. Duration:stable Anxious mood: occasional situational Excessive worrying: no Irritability: no  Sweating: no Nausea: no Palpitations:no Hyperventilation: no Panic attacks: no Agoraphobia: no  Obscessions/compulsions: no Depressed mood: no    April 24, 2024   11:20 AM 10/12/2023    2:01 PM 08/30/2023    3:58  PM 03/10/2023   11:16 AM 09/08/2022    1:18 PM  Depression screen PHQ 2/9  Decreased Interest 0 0 0 0 0  Down, Depressed, Hopeless 0 0 0 0 0  PHQ - 2 Score 0 0 0 0 0  Altered sleeping 0 0 0 0 0  Tired, decreased energy 0 0 0 0 0  Change in appetite 0 0 0 0 0  Feeling bad or failure about yourself  0 0 0 0 0  Trouble concentrating 0 0 0 0 0  Moving slowly or fidgety/restless 0 0 0 0 0  Suicidal thoughts 0 0 0 0 0  PHQ-9 Score 0 0 0 0 0  Difficult doing work/chores Not difficult at all Not difficult at all Not difficult at all Not difficult at all   Anhedonia: no Weight changes: no Insomnia: none Hypersomnia: no Fatigue/loss of energy: no Feelings of worthlessness: no Feelings of guilt: no Impaired concentration/indecisiveness: no Suicidal ideations: no  Crying spells: no Recent Stressors/Life Changes: no   Relationship problems: no   Family stress: no     Financial stress: no    Job stress: no    Recent death/loss: no     April 24, 2024   11:20 AM 10/12/2023    2:02 PM 03/10/2023   11:16 AM  09/08/2022    1:18 PM  GAD 7 : Generalized Anxiety Score  Nervous, Anxious, on Edge 0 0 0 0  Control/stop worrying 0 0 0 0  Worry too much - different things 0 0 0 0  Trouble relaxing 0 0 0 0  Restless 0 0 0 0  Easily annoyed or irritable 0 0 0 0  Afraid - awful might happen 0 0 0 0  Total GAD 7 Score 0 0 0 0  Anxiety Difficulty Not difficult at all Not difficult at all Not difficult at all Not difficult at all   Relevant past medical, surgical, family and social history reviewed and updated as indicated. Interim medical history since our last visit reviewed. Allergies and medications reviewed and updated.  Review of Systems  Constitutional:  Negative for activity change, appetite change, diaphoresis, fatigue and fever.  Respiratory:  Negative for cough, chest tightness and shortness of breath.   Cardiovascular:  Negative for chest pain, palpitations and leg swelling.  Neurological:  Negative.   Psychiatric/Behavioral: Negative.      Per HPI unless specifically indicated above     Objective:    BP 122/79   Pulse 61   Temp 98.2 F (36.8 C) (Oral)   Ht 5\' 5"  (1.651 m)   Wt 151 lb 3.2 oz (68.6 kg)   LMP  (LMP Unknown)   SpO2 98%   BMI 25.16 kg/m   Wt Readings from Last 3 Encounters:  04/11/24 151 lb 3.2 oz (68.6 kg)  11/24/23 154 lb 1.6 oz (69.9 kg)  10/12/23 149 lb 3.2 oz (67.7 kg)    Physical Exam Vitals and nursing note reviewed.  Constitutional:      General: She is awake. She is not in acute distress.    Appearance: She is well-developed and well-groomed. She is not ill-appearing or toxic-appearing.  HENT:     Head: Normocephalic.     Right Ear: Hearing normal.     Left Ear: Hearing normal.     Nose: Nose normal.  Eyes:     General: Lids are normal.        Right eye: No discharge.        Left eye: No discharge.     Conjunctiva/sclera: Conjunctivae normal.     Pupils: Pupils are equal, round, and reactive to light.  Neck:     Thyroid: No thyromegaly.     Vascular: No carotid bruit.  Cardiovascular:     Rate and Rhythm: Normal rate and regular rhythm.     Heart sounds: Normal heart sounds. No murmur heard.    No gallop.  Pulmonary:     Effort: Pulmonary effort is normal. No accessory muscle usage or respiratory distress.     Breath sounds: Normal breath sounds.  Abdominal:     General: Bowel sounds are normal.     Palpations: Abdomen is soft.  Musculoskeletal:     Cervical back: Normal range of motion and neck supple.     Right lower leg: No edema.     Left lower leg: No edema.  Lymphadenopathy:     Cervical: No cervical adenopathy.  Skin:    General: Skin is warm and dry.  Neurological:     Mental Status: She is alert and oriented to person, place, and time.  Psychiatric:        Attention and Perception: Attention normal.        Mood and Affect: Mood normal.        Speech: Speech  normal.        Behavior: Behavior normal.  Behavior is cooperative.        Thought Content: Thought content normal.    Results for orders placed or performed in visit on 10/12/23  [id number] 11+Oxyco+Alc+Crt-Bund   Collection Time: 10/12/23  3:09 PM  Result Value Ref Range   Ethanol Negative Cutoff=0.020 %   Amphetamines, Urine Negative Cutoff=1000 ng/mL   Barbiturate Negative Cutoff=200 ng/mL   BENZODIAZ UR QL Negative Cutoff=200 ng/mL   Cannabinoid Quant, Ur Negative Cutoff=50 ng/mL   Cocaine (Metabolite) Negative Cutoff=300 ng/mL   OPIATE SCREEN URINE Negative Cutoff=300 ng/mL   Oxycodone/Oxymorphone, Urine Negative Cutoff=300 ng/mL   Phencyclidine Negative Cutoff=25 ng/mL   Methadone Screen, Urine Negative Cutoff=300 ng/mL   Propoxyphene Negative Cutoff=300 ng/mL   Meperidine Negative Cutoff=200 ng/mL   Tramadol  Negative Cutoff=200 ng/mL   Creatinine 49.4 20.0 - 300.0 mg/dL   pH, Urine 5.6 4.5 - 8.9  CBC with Differential/Platelet   Collection Time: 10/12/23  3:10 PM  Result Value Ref Range   WBC 6.9 3.4 - 10.8 x10E3/uL   RBC 4.14 3.77 - 5.28 x10E6/uL   Hemoglobin 13.5 11.1 - 15.9 g/dL   Hematocrit 04.5 40.9 - 46.6 %   MCV 97 79 - 97 fL   MCH 32.6 26.6 - 33.0 pg   MCHC 33.8 31.5 - 35.7 g/dL   RDW 81.1 91.4 - 78.2 %   Platelets 305 150 - 450 x10E3/uL   Neutrophils 51 Not Estab. %   Lymphs 37 Not Estab. %   Monocytes 8 Not Estab. %   Eos 3 Not Estab. %   Basos 1 Not Estab. %   Neutrophils Absolute 3.6 1.4 - 7.0 x10E3/uL   Lymphocytes Absolute 2.6 0.7 - 3.1 x10E3/uL   Monocytes Absolute 0.5 0.1 - 0.9 x10E3/uL   EOS (ABSOLUTE) 0.2 0.0 - 0.4 x10E3/uL   Basophils Absolute 0.1 0.0 - 0.2 x10E3/uL   Immature Granulocytes 0 Not Estab. %   Immature Grans (Abs) 0.0 0.0 - 0.1 x10E3/uL  Comprehensive metabolic panel   Collection Time: 10/12/23  3:10 PM  Result Value Ref Range   Glucose 86 70 - 99 mg/dL   BUN 18 8 - 27 mg/dL   Creatinine, Ser 9.56 0.57 - 1.00 mg/dL   eGFR 74 >21 HY/QMV/7.84   BUN/Creatinine Ratio 21 12  - 28   Sodium 139 134 - 144 mmol/L   Potassium 4.0 3.5 - 5.2 mmol/L   Chloride 101 96 - 106 mmol/L   CO2 24 20 - 29 mmol/L   Calcium 9.6 8.7 - 10.3 mg/dL   Total Protein 6.7 6.0 - 8.5 g/dL   Albumin 4.3 3.8 - 4.8 g/dL   Globulin, Total 2.4 1.5 - 4.5 g/dL   Bilirubin Total 0.6 0.0 - 1.2 mg/dL   Alkaline Phosphatase 81 44 - 121 IU/L   AST 15 0 - 40 IU/L   ALT 17 0 - 32 IU/L  Lipid Panel w/o Chol/HDL Ratio   Collection Time: 10/12/23  3:10 PM  Result Value Ref Range   Cholesterol, Total 221 (H) 100 - 199 mg/dL   Triglycerides 82 0 - 149 mg/dL   HDL 74 >69 mg/dL   VLDL Cholesterol Cal 14 5 - 40 mg/dL   LDL Chol Calc (NIH) 629 (H) 0 - 99 mg/dL  TSH   Collection Time: 10/12/23  3:10 PM  Result Value Ref Range   TSH 0.825 0.450 - 4.500 uIU/mL  Magnesium   Collection Time: 10/12/23  3:10 PM  Result Value Ref Range   Magnesium 2.3 1.6 - 2.3 mg/dL  VITAMIN D  25 Hydroxy (Vit-D Deficiency, Fractures)   Collection Time: 10/12/23  3:10 PM  Result Value Ref Range   Vit D, 25-Hydroxy 72.6 30.0 - 100.0 ng/mL      Assessment & Plan:   Problem List Items Addressed This Visit       Other   Postmenopausal bleeding   Overall reassuring work-up with gynecology.  Reviewed recent notes and imaging.  Continue this collaboration.  UA and wet prep overall reassuring today with negative findings.      Long-term current use of benzodiazepine   Reviewed anxiety plan of care.      Hyperlipidemia   Currently diet controlled with recent ASCVD 9.5%.  She prefers not to take medication, but discussed with her numbers are at level for medication.  Also discussed obtaining Calcium Scoring.  Continue diet focus and exercise -- recheck lipid panel at physical.      Anxiety - Primary   Chronic, stable with minimal use of Ativan .  #30 pills lasts 6 + months most often on PDMP review and discussion with patient.  Up tp date on refills.  She is aware of risks of benzo use chronically.  UDS due next  10/11/24, obtain contract next visit.  Denies SI/HI.      Other Visit Diagnoses       Dysuria       Overall negative findings.   Relevant Orders   WET PREP FOR TRICH, YEAST, CLUE   Urinalysis, Routine w reflex microscopic        Follow up plan: Return in about 6 months (around 10/12/2024) for Annual Physical -- after 10/11/24.

## 2024-07-26 ENCOUNTER — Telehealth: Payer: Self-pay

## 2024-07-26 NOTE — Telephone Encounter (Signed)
 Noted, will need visit:)

## 2024-07-26 NOTE — Telephone Encounter (Signed)
 Patient scheduled for 4:20 pm 07/27/2024. She appreciates the appointment.

## 2024-07-26 NOTE — Telephone Encounter (Signed)
 Copied from CRM #[id number]. Topic: Clinical - Medical Advice >> Jul 26, 2024  1:05 PM Sophia H wrote: Reason for CRM: Patient states she has been in and out of urgent care/ED a few days ago. Needing to know if provider can send in pain meds to help. Was given a short prescription while at the ED and is running out. Would like to speak with PCP or the PCP's nurse only regarding pain/blood pressure, declined NT. Patient was crying/could tell she was upset, CAL states just send a message.

## 2024-07-27 ENCOUNTER — Telehealth (INDEPENDENT_AMBULATORY_CARE_PROVIDER_SITE_OTHER): Admitting: Nurse Practitioner

## 2024-07-27 DIAGNOSIS — M5442 Lumbago with sciatica, left side: Secondary | ICD-10-CM | POA: Diagnosis not present

## 2024-07-27 MED ORDER — HYDROCODONE-ACETAMINOPHEN 5-325 MG PO TABS: 1.0000 | ORAL_TABLET | Freq: Four times a day (QID) | ORAL | 0 refills | Status: AC | PRN

## 2024-07-27 NOTE — Patient Instructions (Signed)
 Acute Back Pain, Adult Acute back pain is sudden and usually short-lived. It is often caused by an injury to the muscles and tissues in the back. The injury may result from: A muscle, tendon, or ligament getting overstretched or torn. Ligaments are tissues that connect bones to each other. Lifting something improperly can cause a back strain. Wear and tear (degeneration) of the spinal disks. Spinal disks are circular tissue that provide cushioning between the bones of the spine (vertebrae). Twisting motions, such as while playing sports or doing yard work. A hit to the back. Arthritis. You may have a physical exam, lab tests, and imaging tests to find the cause of your pain. Acute back pain usually goes away with rest and home care. Follow these instructions at home: Managing pain, stiffness, and swelling Take over-the-counter and prescription medicines only as told by your health care provider. Treatment may include medicines for pain and inflammation that are taken by mouth or applied to the skin, or muscle relaxants. Your health care provider may recommend applying ice during the first 24-48 hours after your pain starts. To do this: Put ice in a plastic bag. Place a towel between your skin and the bag. Leave the ice on for 20 minutes, 2-3 times a day. Remove the ice if your skin turns bright red. This is very important. If you cannot feel pain, heat, or cold, you have a greater risk of damage to the area. If directed, apply heat to the affected area as often as told by your health care provider. Use the heat source that your health care provider recommends, such as a moist heat pack or a heating pad. Place a towel between your skin and the heat source. Leave the heat on for 20-30 minutes. Remove the heat if your skin turns bright red. This is especially important if you are unable to feel pain, heat, or cold. You have a greater risk of getting burned. Activity  Do not stay in bed. Staying in  bed for more than 1-2 days can delay your recovery. Sit up and stand up straight. Avoid leaning forward when you sit or hunching over when you stand. If you work at a desk, sit close to it so you do not need to lean over. Keep your chin tucked in. Keep your neck drawn back, and keep your elbows bent at a 90-degree angle (right angle). Sit high and close to the steering wheel when you drive. Add lower back (lumbar) support to your car seat, if needed. Take short walks on even surfaces as soon as you are able. Try to increase the length of time you walk each day. Do not sit, drive, or stand in one place for more than 30 minutes at a time. Sitting or standing for long periods of time can put stress on your back. Do not drive or use heavy machinery while taking prescription pain medicine. Use proper lifting techniques. When you bend and lift, use positions that put less stress on your back: [place] your knees. Keep the load close to your body. Avoid twisting. Exercise regularly as told by your health care provider. Exercising helps your back heal faster and helps prevent back injuries by keeping muscles strong and flexible. Work with a physical therapist to make a safe exercise program, as recommended by your health care provider. Do any exercises as told by your physical therapist. Lifestyle Maintain a healthy weight. Extra weight puts stress on your back and makes it difficult to have good  posture. Avoid activities or situations that make you feel anxious or stressed. Stress and anxiety increase muscle tension and can make back pain worse. Learn ways to manage anxiety and stress, such as through exercise. General instructions Sleep on a firm mattress in a comfortable position. Try lying on your side with your knees slightly bent. If you lie on your back, put a pillow under your knees. Keep your head and neck in a straight line with your spine (neutral position) when using electronic equipment like  smartphones or pads. To do this: Raise your smartphone or pad to look at it instead of bending your head or neck to look down. Put the smartphone or pad at the level of your face while looking at the screen. Follow your treatment plan as told by your health care provider. This may include: Cognitive or behavioral therapy. Acupuncture or massage therapy. Meditation or yoga. Contact a health care provider if: You have pain that is not relieved with rest or medicine. You have increasing pain going down into your legs or buttocks. Your pain does not improve after 2 weeks. You have pain at night. You lose weight without trying. You have a fever or chills. You develop nausea or vomiting. You develop abdominal pain. Get help right away if: You develop new bowel or bladder control problems. You have unusual weakness or numbness in your arms or legs. You feel faint. These symptoms may represent a serious problem that is an emergency. Do not wait to see if the symptoms will go away. Get medical help right away. Call your local emergency services (911 in the [place] Do not drive yourself to the hospital. Summary Acute back pain is sudden and usually short-lived. Use proper lifting techniques. When you bend and lift, use positions that put less stress on your back. Take over-the-counter and prescription medicines only as told by your health care provider, and apply heat or ice as told. This information is not intended to replace advice given to you by your health care provider. Make sure you discuss any questions you have with your health care provider. Document Revised: 01/23/2021 Document Reviewed: 01/23/2021 Elsevier Patient Education  2024 [employer].

## 2024-07-27 NOTE — Progress Notes (Signed)
 LMP  (LMP Unknown)    Subjective:    Patient ID: Kristen May, female    DOB: 05/01/1951, 73 y.o.   MRN: [id number]  HPI: Kristen May is a 73 y.o. female  Chief Complaint  Patient presents with   Follow-up    ED visit- back pain today is better. Chiropractor visit today and yesterday.    Lab tests    Wondering about her urine tests when done in the ED.    Virtual Visit via Video Note  I connected with Kristen May on 07/27/24 at  4:20 PM EDT by a video enabled telemedicine application and verified that I am speaking with the correct person using two identifiers.  Location: Patient: home Provider: work   I discussed the limitations of evaluation and management by telemedicine and the availability of in person appointments. The patient expressed understanding and agreed to proceed.  I discussed the assessment and treatment plan with the patient. The patient was provided an opportunity to ask questions and all were answered. The patient agreed with the plan and demonstrated an understanding of the instructions.   The patient was advised to call back or seek an in-person evaluation if the symptoms worsen or if the condition fails to improve as anticipated.  I provided 25 minutes of non-face-to-face time during this encounter.   Kristen Matarazzo T Tigerlily Christine, NP   ER FOLLOW UP Was seen in ER at [facility] on 07/24/24 for left lumbar pain.  [place] walking [place] beach prior to this, took a long walk.  Got stiff and by the next day it was excruciating.  Went to urgent care x 2 while at beach prior to ER visit. Pain was 8-10 at the time.  Pain at present is beginning to feel better.  When to chiropractor yesterday.  Pain was 3-4 this morning and maybe reached a 5 today. Tolerates codeine  okay.  Was given some Oxycodone in UC.   Time since discharge: 3 days Hospital/facility: [facility] Diagnosis: Lumbar left sided pain Procedures/tests: Blood work and urine Consultants: none New medications:  pain medications Discharge instructions: improving Status: stable   Relevant past medical, surgical, family and social history reviewed and updated as indicated. Interim medical history since our last visit reviewed. Allergies and medications reviewed and updated.  Review of Systems  Constitutional:  Negative for activity change, appetite change, diaphoresis, fatigue and fever.  Respiratory:  Negative for cough, chest tightness and shortness of breath.   Cardiovascular:  Negative for chest pain, palpitations and leg swelling.  Musculoskeletal:  Positive for back pain.  Neurological: Negative.   Psychiatric/Behavioral: Negative.      Per HPI unless specifically indicated above     Objective:    LMP  (LMP Unknown)   Wt Readings from Last 3 Encounters:  04/11/24 151 lb 3.2 oz (68.6 kg)  11/24/23 154 lb 1.6 oz (69.9 kg)  10/12/23 149 lb 3.2 oz (67.7 kg)    Physical Exam Vitals and nursing note reviewed.  Constitutional:      General: She is awake. She is not in acute distress.    Appearance: She is well-developed. She is not ill-appearing.  HENT:     Head: Normocephalic.     Right Ear: Hearing normal.     Left Ear: Hearing normal.  Eyes:     General: Lids are normal.        Right eye: No discharge.        Left eye: No discharge.  Conjunctiva/sclera: Conjunctivae normal.  Pulmonary:     Effort: Pulmonary effort is normal. No accessory muscle usage or respiratory distress.  Musculoskeletal:     Cervical back: Normal range of motion.  Neurological:     Mental Status: She is alert and oriented to person, place, and time.  Psychiatric:        Attention and Perception: Attention normal.        Mood and Affect: Mood normal.        Behavior: Behavior normal. Behavior is cooperative.        Thought Content: Thought content normal.        Judgment: Judgment normal.    Results for orders placed or performed in visit on 04/11/24  WET PREP FOR TRICH, YEAST, CLUE    Collection Time: 04/11/24 11:19 AM   Specimen: Urine   Urine  Result Value Ref Range   Trichomonas Exam Negative Negative   Yeast Exam Negative Negative   Clue Cell Exam Negative Negative  Urinalysis, Routine w reflex microscopic   Collection Time: 04/11/24 11:19 AM  Result Value Ref Range   Specific Gravity, UA 1.020 1.005 - 1.030   pH, UA 6.0 5.0 - 7.5   Color, UA Yellow Yellow   Appearance Ur Clear Clear   Leukocytes,UA Negative Negative   Protein,UA Negative Negative/Trace   Glucose, UA Negative Negative   Ketones, UA Negative Negative   RBC, UA Negative Negative   Bilirubin, UA Negative Negative   Urobilinogen, Ur 0.2 0.2 - 1.0 mg/dL   Nitrite, UA Negative Negative   Microscopic Examination Comment       Assessment & Plan:   Problem List Items Addressed This Visit       Other   Lumbago - Primary   Acute and improving.  Will send in refill on pain medication for 5 day supply to use only as needed.  Recommend use of Tylenol  as needed at home + alternating ice/heat.  May perform gentle PT stretches at home.  If worsening or continued pain, notify provider.  Continue to collaborate with chiropractor.      Relevant Medications   cyclobenzaprine  (FLEXERIL ) 5 MG tablet   HYDROcodone -acetaminophen  (NORCO/VICODIN) 5-325 MG tablet     Follow up plan: Return in about 4 weeks (around 08/24/2024), or if symptoms worsen or fail to improve, for HTN.

## 2024-07-27 NOTE — Assessment & Plan Note (Signed)
 Acute and improving.  Will send in refill on pain medication for 5 day supply to use only as needed.  Recommend use of Tylenol  as needed at home + alternating ice/heat.  May perform gentle PT stretches at home.  If worsening or continued pain, notify provider.  Continue to collaborate with chiropractor.

## 2024-07-30 NOTE — Progress Notes (Signed)
 Scheduled. She wanted to come on Friday 10/17

## 2024-08-15 ENCOUNTER — Encounter: Payer: Self-pay | Admitting: Nurse Practitioner

## 2024-08-26 NOTE — Patient Instructions (Addendum)
 We want your BP below 130/80. No Ibuprofen. Low caffeine use. Low alcohol use. We increased Amlodipine to 5 MG daily, pick that up.  DASH Eating Plan DASH stands for Dietary Approaches to Stop Hypertension. The DASH eating plan is a healthy eating plan that has been shown to: Lower high blood pressure (hypertension). Reduce your risk for type 2 diabetes, heart disease, and stroke. Help with weight loss. What are tips for following this plan? Reading food labels Check food labels for the amount of salt (sodium) per serving. Choose foods with less than 5 percent of the Daily Value (DV) of sodium. In general, foods with less than 300 milligrams (mg) of sodium per serving fit into this eating plan. To find whole grains, look for the word whole as the first word in the ingredient list. Shopping Buy products labeled as low-sodium or no salt added. Buy fresh foods. Avoid canned foods and pre-made or frozen meals. Cooking Try not to add salt when you cook. Use salt-free seasonings or herbs instead of table salt or sea salt. Check with your health care provider or pharmacist before using salt substitutes. Do not fry foods. Cook foods in healthy ways, such as baking, boiling, grilling, roasting, or broiling. Cook using oils that are good for your heart. These include olive, canola, avocado, soybean, and sunflower oil. Meal planning  Eat a balanced diet. This should include: 4 or more servings of fruits and 4 or more servings of vegetables each day. Try to fill half of your plate with fruits and vegetables. 6-8 servings of whole grains each day. 6 or less servings of lean meat, poultry, or fish each day. 1 oz is 1 serving. A 3 oz (85 g) serving of meat is about the same size as the palm of your hand. One egg is 1 oz (28 g). 2-3 servings of low-fat dairy each day. One serving is 1 cup (237 mL). 1 serving of nuts, seeds, or beans 5 times each week. 2-3 servings of heart-healthy fats. Healthy  fats called omega-3 fatty acids are found in foods such as walnuts, flaxseeds, fortified milks, and eggs. These fats are also found in cold-water fish, such as sardines, salmon, and mackerel. Limit how much you eat of: Canned or prepackaged foods. Food that is high in trans fat, such as fried foods. Food that is high in saturated fat, such as fatty meat. Desserts and other sweets, sugary drinks, and other foods with added sugar. Full-fat dairy products. Do not salt foods before eating. Do not eat more than 4 egg yolks a week. Try to eat at least 2 vegetarian meals a week. Eat more home-cooked food and less restaurant, buffet, and fast food. Lifestyle When eating at a restaurant, ask if your food can be made with less salt or no salt. If you drink alcohol: Limit how much you have to: 0-1 drink a day if you are female. 0-2 drinks a day if you are female. Know how much alcohol is in your drink. In the [place] one drink is one 12 oz bottle of beer (355 mL), one 5 oz glass of wine (148 mL), or one 1 oz glass of hard liquor (44 mL). General information Avoid eating more than 2,300 mg of salt a day. If you have hypertension, you may need to reduce your sodium intake to 1,500 mg a day. Work with your provider to stay at a healthy body weight or lose weight. Ask what the best weight range is for you. On  most days of the week, get at least 30 minutes of exercise that causes your heart to beat faster. This may include walking, swimming, or biking. Work with your provider or dietitian to adjust your eating plan to meet your specific calorie needs. What foods should I eat? Fruits All fresh, dried, or frozen fruit. Canned fruits that are in their natural juice and do not have sugar added to them. Vegetables Fresh or frozen vegetables that are raw, steamed, roasted, or grilled. Low-sodium or reduced-sodium tomato and vegetable juice. Low-sodium or reduced-sodium tomato sauce and tomato paste. Low-sodium or  reduced-sodium canned vegetables. Grains Whole-grain or whole-wheat bread. Whole-grain or whole-wheat pasta. Brown rice. Mcneil Madeira. Bulgur. Whole-grain and low-sodium cereals. Pita bread. Low-fat, low-sodium crackers. Whole-wheat flour tortillas. Meats and other proteins Skinless chicken or mal[place]. Ground chicken or [place]. Pork with fat trimmed off. Fish and seafood. Egg whites. Dried beans, peas, or lentils. Unsalted nuts, nut butters, and seeds. Unsalted canned beans. Lean cuts of beef with fat trimmed off. Low-sodium, lean precooked or cured meat, such as sausages or meat loaves. Dairy Low-fat (1%) or fat-free (skim) milk. Reduced-fat, low-fat, or fat-free cheeses. Nonfat, low-sodium ricotta or cottage cheese. Low-fat or nonfat yogurt. Low-fat, low-sodium cheese. Fats and oils Soft margarine without trans fats. Vegetable oil. Reduced-fat, low-fat, or light mayonnaise and salad dressings (reduced-sodium). Canola, safflower, olive, avocado, soybean, and sunflower oils. Avocado. Seasonings and condiments Herbs. Spices. Seasoning mixes without salt. Other foods Unsalted popcorn and pretzels. Fat-free sweets. The items listed above may not be all the foods and drinks you can have. Talk to a dietitian to learn more. What foods should I avoid? Fruits Canned fruit in a light or heavy syrup. Fried fruit. Fruit in cream or butter sauce. Vegetables Creamed or fried vegetables. Vegetables in a cheese sauce. Regular canned vegetables that are not marked as low-sodium or reduced-sodium. Regular canned tomato sauce and paste that are not marked as low-sodium or reduced-sodium. Regular tomato and vegetable juices that are not marked as low-sodium or reduced-sodium. Dene. Olives. Grains Baked goods made with fat, such as croissants, muffins, or some breads. Dry pasta or rice meal packs. Meats and other proteins Fatty cuts of meat. Ribs. Fried meat. Aldona. Bologna, salami, and other precooked or  cured meats, such as sausages or meat loaves, that are not lean and low in sodium. Fat from the back of a pig (fatback). Bratwurst. Salted nuts and seeds. Canned beans with added salt. Canned or smoked fish. Whole eggs or egg yolks. Chicken or [place] with skin. Dairy Whole or 2% milk, cream, and half-and-half. Whole or full-fat cream cheese. Whole-fat or sweetened yogurt. Full-fat cheese. Nondairy creamers. Whipped toppings. Processed cheese and cheese spreads. Fats and oils Butter. Stick margarine. Lard. Shortening. Ghee. Bacon fat. Tropical oils, such as coconut, palm kernel, or palm oil. Seasonings and condiments Onion salt, garlic salt, seasoned salt, table salt, and sea salt. Worcestershire sauce. Tartar sauce. Barbecue sauce. Teriyaki sauce. Soy sauce, including reduced-sodium soy sauce. Steak sauce. Canned and packaged gravies. Fish sauce. Oyster sauce. Cocktail sauce. Store-bought horseradish. Ketchup. Mustard. Meat flavorings and tenderizers. Bouillon cubes. Hot sauces. Pre-made or packaged marinades. Pre-made or packaged taco seasonings. Relishes. Regular salad dressings. Other foods Salted popcorn and pretzels. The items listed above may not be all the foods and drinks you should avoid. Talk to a dietitian to learn more. Where to find more information National Heart, Lung, and Blood Institute (NHLBI): BuffaloDryCleaner.gl American Heart Association (AHA): heart.org Academy of Nutrition and  Dietetics: eatright.org National Kidney Foundation (NKF): kidney.org This information is not intended to replace advice given to you by your health care provider. Make sure you discuss any questions you have with your health care provider. Document Revised: 11/18/2022 Document Reviewed: 11/18/2022 Elsevier Patient Education  2024 [employer].

## 2024-08-27 ENCOUNTER — Other Ambulatory Visit: Payer: Self-pay | Admitting: Nurse Practitioner

## 2024-08-27 NOTE — Telephone Encounter (Unsigned)
 Copied from CRM [id number]. Topic: Clinical - Medication Refill >> Aug 27, 2024  2:19 PM Paige D wrote: Medication: gabapentin  (NEURONTIN ) 100 MG capsule  Has the patient contacted their pharmacy? No (Agent: If no, request that the patient contact the pharmacy for the refill. If patient does not wish to contact the pharmacy document the reason why and proceed with request.) (Agent: If yes, when and what did the pharmacy advise?)  This is the patient's preferred pharmacy:    [facility] [phone] [place], [place] - [address] [place] [address] Phone: [phone] Fax: [phone]    Is this the correct pharmacy for this prescription? Yes If no, delete pharmacy and type the correct one.   Has the prescription been filled recently? No, a month ago   Is the patient out of the medication? No has today and tomorrow left   Has the patient been seen for an appointment in the last year OR does the patient have an upcoming appointment? Yes  Can we respond through MyChart? Yes  Agent: Please be advised that Rx refills may take up to 3 business days. We ask that you follow-up with your pharmacy.

## 2024-08-29 MED ORDER — GABAPENTIN 100 MG PO CAPS: 100.0000 mg | ORAL_CAPSULE | Freq: Two times a day (BID) | ORAL | 11 refills | Status: AC

## 2024-08-29 NOTE — Telephone Encounter (Signed)
 Requested medication (s) are due for refill today: yes  Requested medication (s) are on the active medication list: no  Last refill:  07/27/24  Future visit scheduled: no  Notes to clinic:  historical medication      Requested Prescriptions  Pending Prescriptions Disp Refills   gabapentin  (NEURONTIN ) 100 MG capsule 60 capsule 11    Sig: Take 1 capsule (100 mg total) by mouth 2 (two) times daily.     Neurology: Anticonvulsants - gabapentin  Passed - 08/29/2024  3:58 PM      Passed - Cr in normal range and within 360 days    Creatinine  Date Value Ref Range Status  10/12/2023 49.4 20.0 - 300.0 mg/dL Final   Creatinine, Ser  Date Value Ref Range Status  10/12/2023 0.84 0.57 - 1.00 mg/dL Final         Passed - Completed PHQ-2 or PHQ-9 in the last 360 days      Passed - Valid encounter within last 12 months    Recent Outpatient Visits           1 month ago Acute left-sided low back pain with left-sided sciatica   [place] [facility] [place], Melanie T, NP   4 months ago Anxiety   [place] [facility] [place], Melanie DASEN, NP       Future Appointments             In 1 month [place], Jolene T, NP [place] [facility], [address]

## 2024-08-31 ENCOUNTER — Encounter: Payer: Self-pay | Admitting: Nurse Practitioner

## 2024-08-31 ENCOUNTER — Ambulatory Visit (INDEPENDENT_AMBULATORY_CARE_PROVIDER_SITE_OTHER): Admitting: Nurse Practitioner

## 2024-08-31 VITALS — BP 153/84 | HR 62 | Temp 98.4°F | Ht 65.0 in | Wt 159.4 lb

## 2024-08-31 DIAGNOSIS — I10 Essential (primary) hypertension: Secondary | ICD-10-CM | POA: Insufficient documentation

## 2024-08-31 DIAGNOSIS — M5442 Lumbago with sciatica, left side: Secondary | ICD-10-CM | POA: Diagnosis not present

## 2024-08-31 DIAGNOSIS — Z23 Encounter for immunization: Secondary | ICD-10-CM

## 2024-08-31 MED ORDER — AMLODIPINE BESYLATE 5 MG PO TABS: 5.0000 mg | ORAL_TABLET | Freq: Every day | ORAL | 3 refills | Status: DC

## 2024-08-31 NOTE — Assessment & Plan Note (Signed)
 Improving.  Continue Gabapentin .  Recommend use of Tylenol  as needed at home + alternating ice/heat.  Continue to collaborate with chiropractor and PT.

## 2024-08-31 NOTE — Progress Notes (Signed)
 BP (!) 153/84   Pulse 62   Temp 98.4 F (36.9 C) (Oral)   Ht 5' 5 (1.651 m)   Wt 159 lb 6.4 oz (72.3 kg)   LMP  (LMP Unknown)   SpO2 98%   BMI 26.53 kg/m    Subjective:    Patient ID: Kristen May, female    DOB: [birth year], 73 y.o.   MRN: [id number]  HPI: Kristen May is a 73 y.o. 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 Hypertension status: uncontrolled  Satisfied with current treatment? yes Duration of hypertension: chronic BP monitoring frequency:  daily BP range: 140-150/80 range BP medication side effects:  no Medication compliance: good compliance Aspirin: no Recurrent headaches: no Visual changes: no Palpitations: no Dyspnea: no Chest pain: no Lower extremity edema: no Dizzy/lightheaded: no   BACK PAIN Taking Gabapentin  100 MG BID and Flexeril . She is following with PT and chiropractor, she reports these are helping. Back imaging on 07/23/24 noted mild levocurvature of the lumbar spine and arthritic changes. Duration: weeks Mechanism of injury: no trauma Location: midline, bilateral, and low back Onset: gradual Severity: 2/10 Quality: more sore no than anything Frequency: intermittent Radiation: none Aggravating factors: lifting and movement Alleviating factors: PT and chiropractor Status: stable Treatments attempted: Gabapentin , Chiropractor and PT  Relief with NSAIDs?: No NSAIDs Taken Nighttime pain:  no Paresthesias / decreased sensation:  no Bowel / bladder incontinence:  no Fevers:  no Dysuria / urinary frequency:  no   Relevant past medical, surgical, family and social history reviewed and updated as indicated. Interim medical history since our last visit reviewed. Allergies and medications reviewed and updated.  Review of Systems  Constitutional:  Negative for activity change, appetite change, diaphoresis, fatigue and fever.   Respiratory:  Negative for cough, chest tightness and shortness of breath.   Cardiovascular:  Negative for chest pain, palpitations and leg swelling.  Musculoskeletal:  Positive for back pain.  Neurological: Negative.   Psychiatric/Behavioral: Negative.      Per HPI unless specifically indicated above     Objective:    BP (!) 153/84   Pulse 62   Temp 98.4 F (36.9 C) (Oral)   Ht 5' 5 (1.651 m)   Wt 159 lb 6.4 oz (72.3 kg)   LMP  (LMP Unknown)   SpO2 98%   BMI 26.53 kg/m   Wt Readings from Last 3 Encounters:  08/31/24 159 lb 6.4 oz (72.3 kg)  04/11/24 151 lb 3.2 oz (68.6 kg)  11/24/23 154 lb 1.6 oz (69.9 kg)    Physical Exam Vitals and nursing note reviewed.  Constitutional:      General: She is awake. She is not in acute distress.    Appearance: She is well-developed and well-groomed. She is not ill-appearing or toxic-appearing.  HENT:     Head: Normocephalic.     Right Ear: Hearing normal.     Left Ear: Hearing normal.     Nose: Nose normal.  Eyes:     General: Lids are normal.        Right eye: No discharge.        Left eye: No discharge.     Conjunctiva/sclera: Conjunctivae normal.     Pupils: Pupils are equal, round, and reactive to light.  Neck:     Thyroid: No thyromegaly.     Vascular: No carotid bruit.  Cardiovascular:  Rate and Rhythm: Normal rate and regular rhythm.     Heart sounds: Normal heart sounds. No murmur heard.    No gallop.  Pulmonary:     Effort: Pulmonary effort is normal. No accessory muscle usage or respiratory distress.     Breath sounds: Normal breath sounds.  Abdominal:     General: Bowel sounds are normal.     Palpations: Abdomen is soft.  Musculoskeletal:     Cervical back: Normal range of motion and neck supple.     Right lower leg: No edema.     Left lower leg: No edema.  Lymphadenopathy:     Cervical: No cervical adenopathy.  Skin:    General: Skin is warm and dry.  Neurological:     Mental Status: She is alert  and oriented to person, place, and time.  Psychiatric:        Attention and Perception: Attention normal.        Mood and Affect: Mood normal.        Speech: Speech normal.        Behavior: Behavior normal. Behavior is cooperative.        Thought Content: Thought content normal.     Results for orders placed or performed in visit on 04/11/24  WET PREP FOR TRICH, YEAST, CLUE   Collection Time: 04/11/24 11:19 AM   Specimen: Urine   Urine  Result Value Ref Range   Trichomonas Exam Negative Negative   Yeast Exam Negative Negative   Clue Cell Exam Negative Negative  Urinalysis, Routine w reflex microscopic   Collection Time: 04/11/24 11:19 AM  Result Value Ref Range   Specific Gravity, UA 1.020 1.005 - 1.030   pH, UA 6.0 5.0 - 7.5   Color, UA Yellow Yellow   Appearance Ur Clear Clear   Leukocytes,UA Negative Negative   Protein,UA Negative Negative/Trace   Glucose, UA Negative Negative   Ketones, UA Negative Negative   RBC, UA Negative Negative   Bilirubin, UA Negative Negative   Urobilinogen, Ur 0.2 0.2 - 1.0 mg/dL   Nitrite, UA Negative Negative   Microscopic Examination Comment       Assessment & Plan:   Problem List Items Addressed This Visit       Cardiovascular and Mediastinum   Essential hypertension - Primary   Ongoing over past months. Will increase her Amlodipine to 5 MG daily, educated her on this. She inquired into Guanfacine, which her daughter takes and recommend -- takes for HTN and ADHD. Discussed with patient this is not commonly used in patients over 65 due to risks of orthostatic changes and AV Block + for ADHD it is approved in 6 to 17 years. Discussed at length with her.  Recommend she monitor BP at least a few mornings a week at home and document. DASH diet at home. Labs today: up to date.         Relevant Medications   amLODipine (NORVASC) 5 MG tablet     Other   Lumbago   Improving.  Continue Gabapentin .  Recommend use of Tylenol  as needed at  home + alternating ice/heat.  Continue to collaborate with chiropractor and PT.      Other Visit Diagnoses       Need for influenza vaccination       Flu vaccine today   Relevant Orders   Flu vaccine HIGH DOSE PF(Fluzone Trivalent) (Completed)        Follow up plan: Return for as scheduled 10/16/24.

## 2024-08-31 NOTE — Assessment & Plan Note (Signed)
 Ongoing over past months. Will increase her Amlodipine to 5 MG daily, educated her on this. She inquired into Guanfacine, which her daughter takes and recommend -- takes for HTN and ADHD. Discussed with patient this is not commonly used in patients over 65 due to risks of orthostatic changes and AV Block + for ADHD it is approved in 6 to 17 years. Discussed at length with her.  Recommend she monitor BP at least a few mornings a week at home and document. DASH diet at home. Labs today: up to date.

## 2024-09-04 ENCOUNTER — Ambulatory Visit: Admitting: Emergency Medicine

## 2024-09-04 VITALS — BP 126/80 | Ht 65.0 in | Wt 159.6 lb

## 2024-09-04 DIAGNOSIS — Z23 Encounter for immunization: Secondary | ICD-10-CM | POA: Diagnosis not present

## 2024-09-04 DIAGNOSIS — Z0000 Encounter for general adult medical examination without abnormal findings: Secondary | ICD-10-CM | POA: Diagnosis not present

## 2024-09-04 NOTE — Patient Instructions (Signed)
 Kristen May,  Thank you for taking the time for your Medicare Wellness Visit. I appreciate your continued commitment to your health goals. Please review the care plan we discussed, and feel free to reach out if I can assist you further.  Medicare recommends these wellness visits once per year to help you and your care team stay ahead of potential health issues. These visits are designed to focus on prevention, allowing your provider to concentrate on managing your acute and chronic conditions during your regular appointments.  Please note that Annual Wellness Visits do not include a physical exam. Some assessments may be limited, especially if the visit was conducted virtually. If needed, we may recommend a separate in-person follow-up with your provider.  Ongoing Care Seeing your primary care provider every 3 to 6 months helps us  monitor your health and provide consistent, personalized care.   Referrals If a referral was made during today's visit and you haven't received any updates within two weeks, please contact the referred provider directly to check on the status.  Recommended Screenings: Keep up the good work!  Health Maintenance  Topic Date Due   COVID-19 Vaccine (10 - Pfizer risk 2025-26 season) 03/05/2025   Medicare Annual Wellness Visit  09/04/2025   Colon Cancer Screening  03/31/2027   DEXA scan (bone density measurement)  07/28/2027   DTaP/Tdap/Td vaccine (4 - Td or Tdap) 08/25/2027   Pneumococcal Vaccine for age over 17  Completed   Flu Shot  Completed   Hepatitis C Screening  Completed   Zoster (Shingles) Vaccine  Completed   Meningitis B Vaccine  Aged Out   Breast Cancer Screening  Discontinued       09/04/2024   11:03 AM  Advanced Directives  Does Patient Have a Medical Advance Directive? No  Would patient like information on creating a medical advance directive? Yes (ED - Information included in AVS)   Advance Care Planning is important because it: Ensures you  receive medical care that aligns with your values, goals, and preferences. Provides guidance to your family and loved ones, reducing the emotional burden of decision-making during critical moments.  Vision: Annual vision screenings are recommended for early detection of glaucoma, cataracts, and diabetic retinopathy. These exams can also reveal signs of chronic conditions such as diabetes and high blood pressure.  Dental: Annual dental screenings help detect early signs of oral cancer, gum disease, and other conditions linked to overall health, including heart disease and diabetes.  Please see the attached documents for additional preventive care recommendations.   Fall Prevention in the Home, Adult Falls can cause injuries and affect people of all ages. There are many simple things that you can do to make your home safe and to help prevent falls. If you need it, ask for help making these changes. What actions can I take to prevent falls? General information Use good lighting in all rooms. Make sure to: Replace any light bulbs that burn out. Turn on lights if it is dark and use night-lights. Keep items that you use often in easy-to-reach places. Lower the shelves around your home if needed. Move furniture so that there are clear paths around it. Do not keep throw rugs or other things on the floor that can make you trip. If any of your floors are uneven, fix them. Add color or contrast paint or tape to clearly mark and help you see: Grab bars or handrails. First and last steps of staircases. Where the edge of each step is.  If you use a ladder or stepladder: Make sure that it is fully opened. Do not climb a closed ladder. Make sure the sides of the ladder are locked in place. Have someone hold the ladder while you use it. Know where your pets are as you move through your home. What can I do in the bathroom?     Keep the floor dry. Clean up any water that is on the floor right  away. Remove soap buildup in the bathtub or shower. Buildup makes bathtubs and showers slippery. Use non-skid mats or decals on the floor of the bathtub or shower. Attach bath mats securely with double-sided, non-slip rug tape. If you need to sit down while you are in the shower, use a non-slip stool. Install grab bars by the toilet and in the bathtub and shower. Do not use towel bars as grab bars. What can I do in the bedroom? Make sure that you have a light by your bed that is easy to reach. Do not use any sheets or blankets on your bed that hang to the floor. Have a firm bench or chair with side arms that you can use for support when you get dressed. What can I do in the kitchen? Clean up any spills right away. If you need to reach something above you, use a sturdy step stool that has a grab bar. Keep electrical cables out of the way. Do not use floor polish or wax that makes floors slippery. What can I do with my stairs? Do not leave anything on the stairs. Make sure that you have a light switch at the top and the bottom of the stairs. Have them installed if you do not have them. Make sure that there are handrails on both sides of the stairs. Fix handrails that are broken or loose. Make sure that handrails are as long as the staircases. Install non-slip stair treads on all stairs in your home if they do not have carpet. Avoid having throw rugs at the top or bottom of stairs, or secure the rugs with carpet tape to prevent them from moving. Choose a carpet design that does not hide the edge of steps on the stairs. Make sure that carpet is firmly attached to the stairs. Fix any carpet that is loose or worn. What can I do on the outside of my home? Use bright outdoor lighting. Repair the edges of walkways and driveways and fix any cracks. Clear paths of anything that can make you trip, such as tools or rocks. Add color or contrast paint or tape to clearly mark and help you see high doorway  thresholds. Trim any bushes or trees on the main path into your home. Check that handrails are securely fastened and in good repair. Both sides of all steps should have handrails. Install guardrails along the edges of any raised decks or porches. Have leaves, snow, and ice cleared regularly. Use sand, salt, or ice melt on walkways during winter months if you live where there is ice and snow. In the garage, clean up any spills right away, including grease or oil spills. What other actions can I take? Review your medicines with your health care provider. Some medicines can make you confused or feel dizzy. This can increase your chance of falling. Wear closed-toe shoes that fit well and support your feet. Wear shoes that have rubber soles and low heels. Use a cane, walker, scooter, or crutches that help you move around if needed. Talk with  your provider about other ways that you can decrease your risk of falls. This may include seeing a physical therapist to learn to do exercises to improve movement and strength. Where to find more information Centers for Disease Control and Prevention, [employer]: TonerPromos.no [employer] on Aging: BaseRingTones.pl National Institute on Aging: BaseRingTones.pl Contact a health care provider if: You are afraid of falling at home. You feel weak, drowsy, or dizzy at home. You fall at home. Get help right away if you: Lose consciousness or have trouble moving after a fall. Have a fall that causes a head injury. These symptoms may be an emergency. Get help right away. Call 911. Do not wait to see if the symptoms will go away. Do not drive yourself to the hospital. This information is not intended to replace advice given to you by your health care provider. Make sure you discuss any questions you have with your health care provider. Document Revised: 07/05/2022 Document Reviewed: 07/05/2022 Elsevier Patient Education  2024 [employer].

## 2024-09-04 NOTE — Progress Notes (Signed)
 Subjective:   Kristen May is a 73 y.o. who presents for a Medicare Wellness preventive visit.  As a reminder, Annual Wellness Visits don't include a physical exam, and some assessments may be limited, especially if this visit is performed virtually. We may recommend an in-person follow-up visit with your provider if needed.  Visit Complete: In person    Persons Participating in Visit: Patient.  AWV Questionnaire: No: Patient Medicare AWV questionnaire was not completed prior to this visit.  Cardiac Risk Factors include: advanced age (>78men, >104 women);dyslipidemia     Objective:    Today's Vitals   09/04/24 1049  BP: 126/80  Weight: 159 lb 9.6 oz (72.4 kg)  Height: 5' 5 (1.651 m)   Body mass index is 26.56 kg/m.     09/04/2024   11:03 AM 08/30/2023    4:00 PM 07/29/2021    5:14 PM  Advanced Directives  Does Patient Have a Medical Advance Directive? No No No  Would patient like information on creating a medical advance directive? Yes (ED - Information included in AVS) Yes (MAU/Ambulatory/Procedural Areas - Information given) No - Patient declined    Current Medications (verified) Outpatient Encounter Medications as of 09/04/2024  Medication Sig   amLODipine (NORVASC) 5 MG tablet Take 1 tablet (5 mg total) by mouth daily.   Cholecalciferol 25 MCG (1000 UT) tablet Take 2,000 Units by mouth daily.   gabapentin  (NEURONTIN ) 100 MG capsule Take 1 capsule (100 mg total) by mouth 2 (two) times daily.   GLYCINE PO Take 5 mLs by mouth daily.   loperamide (IMODIUM) 2 MG capsule Take 2 mg by mouth 3 (three) times daily as needed for diarrhea or loose stools.   LORazepam  (ATIVAN ) 1 MG tablet Take 1 tablet (1 mg total) by mouth at bedtime as needed for anxiety.   pantoprazole (PROTONIX) 40 MG tablet Take 40 mg by mouth 2 (two) times daily.   Probiotic Product (PROBIOTIC PO) Take 1 tablet by mouth daily.   VITAMIN E PO Take 1 tablet by mouth daily.   cyclobenzaprine  (FLEXERIL ) 5  MG tablet Take 5 mg by mouth at bedtime. (Patient not taking: Reported on 09/04/2024)   No facility-administered encounter medications on file as of 09/04/2024.    Allergies (verified) Morphine   History: Past Medical History:  Diagnosis Date   Anxiety    Back pain    Cervicalgia    Depression    Elevated vitamin B12 level    IBS (irritable bowel syndrome)    Insomnia    Lumbago    Osteopenia    Past Surgical History:  Procedure Laterality Date   CHOLECYSTECTOMY     CHOLESTEATOMA EXCISION     eye surgerry     SKIN GRAFT     Family History  Problem Relation Age of Onset   Cancer Mother        stomach   COPD Mother    Heart disease Father    Cancer Father        colon   Diabetes Father    Polycystic ovary syndrome Daughter    Cancer Paternal Grandfather        rectal   Depression Daughter    Bipolar disorder Daughter    Social History   Socioeconomic History   Marital status: Widowed    Spouse name: Not on file   Number of children: 4   Years of education: Not on file   Highest education level: Bachelor's degree (e.g., BA, AB,  BS)  Occupational History   Occupation: retired  Tobacco Use   Smoking status: Former    Current packs/day: 0.00    Average packs/day: 3.0 packs/day for 7.0 years (21.0 ttl pk-yrs)    Types: Cigarettes    Start date: 81    Quit date: 1980    Years since quitting: 45.8   Smokeless tobacco: Never  Vaping Use   Vaping status: Never Used  Substance and Sexual Activity   Alcohol use: Yes    Alcohol/week: 6.0 standard drinks of alcohol    Types: 6 Standard drinks or equivalent per week    Comment: 3 drinks, 2 days on the weekends   Drug use: No   Sexual activity: Yes  Other Topics Concern   Not on file  Social History Narrative   Not on file   Social Drivers of Health   Financial Resource Strain: Low Risk  (09/04/2024)   Overall Financial Resource Strain (CARDIA)    Difficulty of Paying Living Expenses: Not hard at all   Food Insecurity: No Food Insecurity (09/04/2024)   Hunger Vital Sign    Worried About Running Out of Food in the Last Year: Never true    Ran Out of Food in the Last Year: Never true  Transportation Needs: No Transportation Needs (09/04/2024)   PRAPARE - Administrator, Civil Service (Medical): No    Lack of Transportation (Non-Medical): No  Physical Activity: Sufficiently Active (09/04/2024)   Exercise Vital Sign    Days of Exercise per Week: 3 days    Minutes of Exercise per Session: 60 min  Stress: No Stress Concern Present (09/04/2024)   [employer] of Occupational Health - Occupational Stress Questionnaire    Feeling of Stress: Only a little  Social Connections: Moderately Isolated (09/04/2024)   Social Connection and Isolation Panel    Frequency of Communication with Friends and Family: More than three times a week    Frequency of Social Gatherings with Friends and Family: More than three times a week    Attends Religious Services: Never    Database administrator or Organizations: Yes    Attends Engineer, structural: More than 4 times per year    Marital Status: Widowed    Tobacco Counseling Counseling given: Not Answered    Clinical Intake:  Pre-visit preparation completed: Yes  Pain : No/denies pain     BMI - recorded: 26.56 Nutritional Status: BMI 25 -29 Overweight Nutritional Risks: None Diabetes: No  No results found for: HGBA1C   How often do you need to have someone help you when you read instructions, pamphlets, or other written materials from your doctor or pharmacy?: 1 - Never  Interpreter Needed?: No  Information entered by :: Vina Ned, CMA   Activities of Daily Living     09/04/2024   10:53 AM 10/12/2023    2:39 PM  In your present state of health, do you have any difficulty performing the following activities:  Hearing? 1 0  Comment wears hearing aid left ear   Vision? 0 0  Difficulty concentrating or  making decisions? 1 0  Comment slight memory issues   Walking or climbing stairs? 0 0  Dressing or bathing? 0 0  Doing errands, shopping? 0 0  Preparing Food and eating ? N   Using the Toilet? N   In the past six months, have you accidently leaked urine? N   Do you have problems with loss of bowel control?  N   Managing your Medications? N   Managing your Finances? N   Housekeeping or managing your Housekeeping? N     Patient Care Team: Cannady, Jolene T, NP as PCP - General (Nurse Practitioner) Linward Aloha GAILS, MD as Referring Physician (Gastroenterology) Delores Franky BIRCH, MD as Referring Physician (Otolaryngology) [facility] (Dermatology) Mevelyn BIRCH Bathe, OD (Optometry)  I have updated your Care Teams any recent Medical Services you may have received from other providers in the past year.     Assessment:   This is a routine wellness examination for [facility].  Hearing/Vision screen Hearing Screening - Comments:: Wears hearing aid left ear Vision Screening - Comments:: Gets routine eye exams, Dr. Bathe Mevelyn Molly Rozel   Goals Addressed               This Visit's Progress     Increase physical activity (pt-stated)         Depression Screen     09/04/2024   11:01 AM 07/27/2024    3:55 PM 04/11/2024   11:20 AM 10/12/2023    2:01 PM 08/30/2023    3:58 PM 03/10/2023   11:16 AM 09/08/2022    1:18 PM  PHQ 2/9 Scores  PHQ - 2 Score 0 0 0 0 0 0 0  PHQ- 9 Score 0  0 0 0 0 0    Fall Risk     09/04/2024   11:06 AM 07/27/2024    3:55 PM 04/11/2024   11:19 AM 10/12/2023    2:01 PM 08/30/2023    4:01 PM  Fall Risk   Falls in the past year? 1 1 1 1  0  Number falls in past yr: 0 0 0 0 0  Injury with Fall? 1 1 0 0 0  Risk for fall due to : History of fall(s);Impaired balance/gait;Orthopedic patient History of fall(s) No Fall Risks History of fall(s) No Fall Risks  Follow up Falls evaluation completed;Education provided Falls evaluation completed Falls  evaluation completed Falls evaluation completed Falls prevention discussed    MEDICARE RISK AT HOME:  Medicare Risk at Home Any stairs in or around the home?: Yes If so, are there any without handrails?: No Home free of loose throw rugs in walkways, pet beds, electrical cords, etc?: Yes Adequate lighting in your home to reduce risk of falls?: Yes Life alert?: No Use of a cane, walker or w/c?: No Grab bars in the bathroom?: No Shower chair or bench in shower?: No Elevated toilet seat or a handicapped toilet?: No  TIMED UP AND GO:  Was the test performed?  No  Cognitive Function: 6CIT completed        09/04/2024   11:07 AM 08/30/2023    4:02 PM 08/05/2022   11:37 AM 07/29/2021    5:16 PM  6CIT Screen  What Year? 0 points 0 points 0 points 0 points  What month? 0 points 0 points 0 points 0 points  What time? 0 points 0 points 0 points 0 points  Count back from 20 0 points 0 points 0 points 0 points  Months in reverse 0 points 0 points 0 points 0 points  Repeat phrase 0 points 0 points 0 points 0 points  Total Score 0 points 0 points 0 points 0 points    Immunizations Immunization History  Administered Date(s) Administered   Fluad Quad(high Dose 65+) 08/24/2017, 10/25/2018, 08/02/2019, 08/12/2020, 08/25/2021   INFLUENZA, HIGH DOSE SEASONAL PF 08/24/2017, 10/25/2018, 07/02/2022, 09/30/2023, 08/31/2024   Influenza,inj,Quad  PF,6+ Mos 11/27/2015, 08/23/2016   Influenza-Unspecified 09/30/2023   PFIZER(Purple Top)SARS-COV-2 Vaccination 12/10/2019, 01/01/2020, 08/12/2020, 02/18/2021, 08/09/2022   Pfizer Covid-19 Vaccine Bivalent Booster 26yrs & up 08/27/2021   Pfizer(Comirnaty)Fall Seasonal Vaccine 12 years and older 08/09/2022, 03/16/2023, 09/30/2023   Pneumococcal Conjugate-13 08/24/2017   Pneumococcal Polysaccharide-23 03/07/2020   Pneumococcal-Unspecified 07/29/2014   Respiratory Syncytial Virus Vaccine,Recomb Aduvanted(Arexvy) 10/05/2022   Td 02/21/2007   Td (Adult), 2 Lf  Tetanus Toxid, Preservative Free 02/21/2007   Tdap 08/24/2017   Unspecified SARS-COV-2 Vaccination 09/30/2023   Zoster Recombinant(Shingrix) 10/26/2022, 03/16/2023    Screening Tests Health Maintenance  Topic Date Due   COVID-19 Vaccine (9 - 2025-26 season) 09/16/2024 (Originally 07/16/2024)   Medicare Annual Wellness (AWV)  09/04/2025   Colonoscopy  03/31/2027   DEXA SCAN  07/28/2027   DTaP/Tdap/Td (4 - Td or Tdap) 08/25/2027   Pneumococcal Vaccine: 50+ Years  Completed   Influenza Vaccine  Completed   Hepatitis C Screening  Completed   Zoster Vaccines- Shingrix  Completed   Meningococcal B Vaccine  Aged Out   Mammogram  Discontinued    Health Maintenance Items Addressed: Vaccines Given today: Covid, See Nurse Notes at the end of this note  Additional Screening:  Vision Screening: Recommended annual ophthalmology exams for early detection of glaucoma and other disorders of the eye. Is the patient up to date with their annual eye exam?  No  Who is the provider or what is the name of the office in which the patient attends annual eye exams? Dr. Robynn Antigua  Dental Screening: Recommended annual dental exams for proper oral hygiene  Community Resource Referral / Chronic Care Management: CRR required this visit?  No   CCM required this visit?  No   Plan:    I have personally reviewed and noted the following in the patient's chart:   Medical and social history Use of alcohol, tobacco or illicit drugs  Current medications and supplements including opioid prescriptions. Patient is not currently taking opioid prescriptions. Functional ability and status Nutritional status Physical activity Advanced directives List of other physicians Hospitalizations, surgeries, and ER visits in previous 12 months Vitals Screenings to include cognitive, depression, and falls Referrals and appointments  In addition, I have reviewed and discussed with patient certain preventive  protocols, quality metrics, and best practice recommendations. A written personalized care plan for preventive services as well as general preventive health recommendations were provided to patient.   Vina Ned, CMA   09/04/2024   After Visit Summary: (In Person-Printed) AVS printed and given to the patient  Notes:  Gave Covid vaccine today. Declined MMG

## 2024-10-13 NOTE — Patient Instructions (Signed)
 Be Involved in Caring For Your Health:  Taking Medications When medications are taken as directed, they can greatly improve your health. But if they are not taken as prescribed, they may not work. In some cases, not taking them correctly can be harmful. To help ensure your treatment remains effective and safe, understand your medications and how to take them. Bring your medications to each visit for review by your provider.  Your lab results, notes, and after visit summary will be available on My Chart. We strongly encourage you to use this feature. If lab results are abnormal the clinic will contact you with the appropriate steps. If the clinic does not contact you assume the results are satisfactory. You can always view your results on My Chart. If you have questions regarding your health or results, please contact the clinic during office hours. You can also ask questions on My Chart.  We at [facility] are grateful that you chose us  to provide your care. We strive to provide evidence-based and compassionate care and are always looking for feedback. If you get a survey from the clinic please complete this so we can hear your opinions.  DASH Eating Plan DASH stands for Dietary Approaches to Stop Hypertension. The DASH eating plan is a healthy eating plan that has been shown to: Lower high blood pressure (hypertension). Reduce your risk for type 2 diabetes, heart disease, and stroke. Help with weight loss. What are tips for following this plan? Reading food labels Check food labels for the amount of salt (sodium) per serving. Choose foods with less than 5 percent of the Daily Value (DV) of sodium. In general, foods with less than 300 milligrams (mg) of sodium per serving fit into this eating plan. To find whole grains, look for the word whole as the first word in the ingredient list. Shopping Buy products labeled as low-sodium or no salt added. Buy fresh foods. Avoid canned  foods and pre-made or frozen meals. Cooking Try not to add salt when you cook. Use salt-free seasonings or herbs instead of table salt or sea salt. Check with your health care provider or pharmacist before using salt substitutes. Do not fry foods. Cook foods in healthy ways, such as baking, boiling, grilling, roasting, or broiling. Cook using oils that are good for your heart. These include olive, canola, avocado, soybean, and sunflower oil. Meal planning  Eat a balanced diet. This should include: 4 or more servings of fruits and 4 or more servings of vegetables each day. Try to fill half of your plate with fruits and vegetables. 6-8 servings of whole grains each day. 6 or less servings of lean meat, poultry, or fish each day. 1 oz is 1 serving. A 3 oz (85 g) serving of meat is about the same size as the palm of your hand. One egg is 1 oz (28 g). 2-3 servings of low-fat dairy each day. One serving is 1 cup (237 mL). 1 serving of nuts, seeds, or beans 5 times each week. 2-3 servings of heart-healthy fats. Healthy fats called omega-3 fatty acids are found in foods such as walnuts, flaxseeds, fortified milks, and eggs. These fats are also found in cold-water fish, such as sardines, salmon, and mackerel. Limit how much you eat of: Canned or prepackaged foods. Food that is high in trans fat, such as fried foods. Food that is high in saturated fat, such as fatty meat. Desserts and other sweets, sugary drinks, and other foods with added sugar. Full-fat  dairy products. Do not salt foods before eating. Do not eat more than 4 egg yolks a week. Try to eat at least 2 vegetarian meals a week. Eat more home-cooked food and less restaurant, buffet, and fast food. Lifestyle When eating at a restaurant, ask if your food can be made with less salt or no salt. If you drink alcohol: Limit how much you have to: 0-1 drink a day if you are female. 0-2 drinks a day if you are female. Know how much alcohol is in  your drink. In the [place] one drink is one 12 oz bottle of beer (355 mL), one 5 oz glass of wine (148 mL), or one 1 oz glass of hard liquor (44 mL). General information Avoid eating more than 2,300 mg of salt a day. If you have hypertension, you may need to reduce your sodium intake to 1,500 mg a day. Work with your provider to stay at a healthy body weight or lose weight. Ask what the best weight range is for you. On most days of the week, get at least 30 minutes of exercise that causes your heart to beat faster. This may include walking, swimming, or biking. Work with your provider or dietitian to adjust your eating plan to meet your specific calorie needs. What foods should I eat? Fruits All fresh, dried, or frozen fruit. Canned fruits that are in their natural juice and do not have sugar added to them. Vegetables Fresh or frozen vegetables that are raw, steamed, roasted, or grilled. Low-sodium or reduced-sodium tomato and vegetable juice. Low-sodium or reduced-sodium tomato sauce and tomato paste. Low-sodium or reduced-sodium canned vegetables. Grains Whole-grain or whole-wheat bread. Whole-grain or whole-wheat pasta. Brown rice. Mcneil Madeira. Bulgur. Whole-grain and low-sodium cereals. Pita bread. Low-fat, low-sodium crackers. Whole-wheat flour tortillas. Meats and other proteins Skinless chicken or [place]. Ground chicken or [place]. Pork with fat trimmed off. Fish and seafood. Egg whites. Dried beans, peas, or lentils. Unsalted nuts, nut butters, and seeds. Unsalted canned beans. Lean cuts of beef with fat trimmed off. Low-sodium, lean precooked or cured meat, such as sausages or meat loaves. Dairy Low-fat (1%) or fat-free (skim) milk. Reduced-fat, low-fat, or fat-free cheeses. Nonfat, low-sodium ricotta or cottage cheese. Low-fat or nonfat yogurt. Low-fat, low-sodium cheese. Fats and oils Soft margarine without trans fats. Vegetable oil. Reduced-fat, low-fat, or light mayonnaise and salad  dressings (reduced-sodium). Canola, safflower, olive, avocado, soybean, and sunflower oils. Avocado. Seasonings and condiments Herbs. Spices. Seasoning mixes without salt. Other foods Unsalted popcorn and pretzels. Fat-free sweets. The items listed above may not be all the foods and drinks you can have. Talk to a dietitian to learn more. What foods should I avoid? Fruits Canned fruit in a light or heavy syrup. Fried fruit. Fruit in cream or butter sauce. Vegetables Creamed or fried vegetables. Vegetables in a cheese sauce. Regular canned vegetables that are not marked as low-sodium or reduced-sodium. Regular canned tomato sauce and paste that are not marked as low-sodium or reduced-sodium. Regular tomato and vegetable juices that are not marked as low-sodium or reduced-sodium. Dene. Olives. Grains Baked goods made with fat, such as croissants, muffins, or some breads. Dry pasta or rice meal packs. Meats and other proteins Fatty cuts of meat. Ribs. Fried meat. [place]. Bologna, salami, and other precooked or cured meats, such as sausages or meat loaves, that are not lean and low in sodium. Fat from the back of a pig (fatback). Bratwurst. Salted nuts and seeds. Canned beans with added salt. Canned  or smoked fish. Whole eggs or egg yolks. Chicken or [place] with skin. Dairy Whole or 2% milk, cream, and half-and-half. Whole or full-fat cream cheese. Whole-fat or sweetened yogurt. Full-fat cheese. Nondairy creamers. Whipped toppings. Processed cheese and cheese spreads. Fats and oils Butter. Stick margarine. Lard. Shortening. Ghee. Bacon fat. Tropical oils, such as coconut, palm kernel, or palm oil. Seasonings and condiments Onion salt, garlic salt, seasoned salt, table salt, and sea salt. Worcestershire sauce. Tartar sauce. Barbecue sauce. Teriyaki sauce. Soy sauce, including reduced-sodium soy sauce. Steak sauce. Canned and packaged gravies. Fish sauce. Oyster sauce. Cocktail sauce. Store-bought  horseradish. Ketchup. Mustard. Meat flavorings and tenderizers. Bouillon cubes. Hot sauces. Pre-made or packaged marinades. Pre-made or packaged taco seasonings. Relishes. Regular salad dressings. Other foods Salted popcorn and pretzels. The items listed above may not be all the foods and drinks you should avoid. Talk to a dietitian to learn more. Where to find more information National Heart, Lung, and Blood Institute (NHLBI): BuffaloDryCleaner.gl American Heart Association (AHA): heart.org Academy of Nutrition and Dietetics: eatright.org National Kidney Foundation (NKF): kidney.org This information is not intended to replace advice given to you by your health care provider. Make sure you discuss any questions you have with your health care provider. Document Revised: 11/18/2022 Document Reviewed: 11/18/2022 Elsevier Patient Education  2024 [employer].

## 2024-10-16 ENCOUNTER — Ambulatory Visit: Admitting: Nurse Practitioner

## 2024-10-16 ENCOUNTER — Encounter: Payer: Self-pay | Admitting: Nurse Practitioner

## 2024-10-16 VITALS — BP 128/82 | HR 57 | Temp 98.1°F | Resp 15 | Ht 65.0 in | Wt 159.6 lb

## 2024-10-16 DIAGNOSIS — F419 Anxiety disorder, unspecified: Secondary | ICD-10-CM | POA: Diagnosis not present

## 2024-10-16 DIAGNOSIS — I10 Essential (primary) hypertension: Secondary | ICD-10-CM | POA: Diagnosis not present

## 2024-10-16 DIAGNOSIS — Z79899 Other long term (current) drug therapy: Secondary | ICD-10-CM

## 2024-10-16 DIAGNOSIS — E782 Mixed hyperlipidemia: Secondary | ICD-10-CM | POA: Diagnosis not present

## 2024-10-16 DIAGNOSIS — M85852 Other specified disorders of bone density and structure, left thigh: Secondary | ICD-10-CM | POA: Diagnosis not present

## 2024-10-16 DIAGNOSIS — K200 Eosinophilic esophagitis: Secondary | ICD-10-CM

## 2024-10-16 DIAGNOSIS — Z0000 Encounter for general adult medical examination without abnormal findings: Secondary | ICD-10-CM

## 2024-10-16 MED ORDER — AMLODIPINE BESYLATE 5 MG PO TABS: 5.0000 mg | ORAL_TABLET | Freq: Every day | ORAL | 3 refills | Status: AC

## 2024-10-16 MED ORDER — LORAZEPAM 1 MG PO TABS: 1.0000 mg | ORAL_TABLET | Freq: Every evening | ORAL | 2 refills | Status: AC | PRN

## 2024-10-16 MED ORDER — TIZANIDINE HCL 4 MG PO TABS: 2.0000 mg | ORAL_TABLET | Freq: Four times a day (QID) | ORAL | 0 refills | Status: DC | PRN

## 2024-10-16 NOTE — Progress Notes (Signed)
 BP 128/82 (BP Location: Left Arm, Patient Position: Sitting, Cuff Size: Normal)   Pulse (!) 57   Temp 98.1 F (36.7 C) (Oral)   Resp 15   Ht 5' 5 (1.651 m)   Wt 159 lb 9.6 oz (72.4 kg)   LMP  (LMP Unknown)   SpO2 98%   BMI 26.56 kg/m    Subjective:    Patient ID: Kristen May, female    DOB: 1951-11-09, 73 y.o.   MRN: [id number]  HPI: Kristen May is a 73 y.o. female presenting on 10/16/2024 for medical examination. Current medical complaints include:none  She currently lives with: husband Menopausal Symptoms: no   EOSINOPHILIC ESOPHAGITIS Takes Protonix 40 MG BID. GERD control status: stable Satisfied with current treatment? yes Heartburn frequency: none Medication side effects: no  Medication compliance: stable Dysphagia: no Odynophagia:  no Hematemesis: no Blood in stool: no EGD: yes   OSTEOPENIA Last DEXA 07/27/22 with T-score -1.5. Continues to follow with physiatry for cervical spondylosis, to continue on Gabapentin  for one more month. They have scheduled her for MRI due to difficulty with some upper body fine motor skills. Adequate calcium & vitamin D : yes takes Vitamin D  Weight bearing exercises: yes   HYPERTENSION / HYPERLIPIDEMIA Taking Amlodipine daily. No statin, focuses on diet and exercise. Satisfied with current treatment? yes Duration of hypertension: chronic BP monitoring frequency: daily BP range: <130/80 BP medication side effects: no Duration of hyperlipidemia: chronic Cholesterol supplements: none Aspirin: no Recent stressors: no Recurrent headaches: no Visual changes: no Palpitations: no Dyspnea: no Chest pain: no Lower extremity edema: no Dizzy/lightheaded: no  The 10-year ASCVD risk score (Arnett DK, et al., 2019) is: 15.4%   Values used to calculate the score:     Age: 88 years     Clincally relevant sex: Female     Is Non-Hispanic African American: No     Diabetic: No     Tobacco smoker: No     Systolic Blood Pressure: 128 mmHg      Is BP treated: Yes     HDL Cholesterol: 74 mg/dL     Total Cholesterol: 221 mg/dL  DEPRESSION Continues Ativan  as needed, has been on for long period, started by previous PCP.  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 Rarely takes this, gets refills once a year #30 pills and 2 refills.  Pt is aware of risks of benzo medication use to include increased sedation, respiratory suppression, falls, dependence and cardiovascular events. Pt would like to continue treatment as benefit determined to outweigh risk.  Last filled 02/22/24. Mood status: stable Satisfied with current treatment?: yes Symptom severity: mild  Duration of current treatment : chronic Side effects: no Medication compliance: good compliance Psychotherapy/counseling: none Depressed mood: no Anxious mood: no Anhedonia: no Significant weight loss or gain: no Insomnia: no Fatigue: no Feelings of worthlessness or guilt: no Impaired concentration/indecisiveness: no Suicidal ideations: no Hopelessness: no Crying spells: no    10/16/2024    1:13 PM 09/04/2024   11:01 AM 07/27/2024    3:55 PM 04/11/2024   11:20 AM 10/12/2023    2:01 PM  Depression screen PHQ 2/9  Decreased Interest 0 0 0 0 0  Down, Depressed, Hopeless 0 0 0 0 0  PHQ - 2 Score 0 0 0 0 0  Altered sleeping 0 0  0 0  Tired, decreased energy 0 0  0 0  Change in appetite 0 0  0 0  Feeling bad or failure about yourself  0  0  0 0  Trouble concentrating 0 0  0 0  Moving slowly or fidgety/restless 0 0  0 0  Suicidal thoughts 0 0  0 0  PHQ-9 Score 0 0   0  0   Difficult doing work/chores  Not difficult at all  Not difficult at all Not difficult at all     Data saved with a previous flowsheet row definition      10/16/2024    1:14 PM 07/27/2024    3:56 PM 04/11/2024   11:20 AM 10/12/2023    2:02 PM  GAD 7 : Generalized Anxiety Score  Nervous, Anxious, on Edge 0 0 0 0  Control/stop worrying 0 0 0 0  Worry too much - different things 0 0 0 0  Trouble relaxing 0 0 0 0   Restless 0 0 0 0  Easily annoyed or irritable 0 0 0 0  Afraid - awful might happen 0 0 0 0  Total GAD 7 Score 0 0 0 0  Anxiety Difficulty   Not difficult at all Not difficult at all      10/16/2024    1:13 PM 09/04/2024   11:06 AM 07/27/2024    3:55 PM 04/11/2024   11:19 AM 10/12/2023    2:01 PM  Fall Risk   Falls in the past year? 1 1 1 1 1   Number falls in past yr: 1 0 0 0 0  Injury with Fall? 1 1  1   0  0   Risk for fall due to : History of fall(s) History of fall(s);Impaired balance/gait;Orthopedic patient History of fall(s) No Fall Risks History of fall(s)  Follow up Falls evaluation completed Falls evaluation completed;Education provided Falls evaluation completed Falls evaluation completed Falls evaluation completed     Data saved with a previous flowsheet row definition    Functional Status Survey: Is the patient deaf or have difficulty hearing?: No Does the patient have difficulty seeing, even when wearing glasses/contacts?: No Does the patient have difficulty concentrating, remembering, or making decisions?: No Does the patient have difficulty walking or climbing stairs?: No Does the patient have difficulty dressing or bathing?: No Does the patient have difficulty doing errands alone such as visiting a doctor's office or shopping?: No   Past Medical History:  Past Medical History:  Diagnosis Date   Anxiety    Back pain    Cervicalgia    Depression    Elevated vitamin B12 level    IBS (irritable bowel syndrome)    Insomnia    Lumbago    Osteopenia     Surgical History:  Past Surgical History:  Procedure Laterality Date   CHOLECYSTECTOMY     CHOLESTEATOMA EXCISION     eye surgerry     SKIN GRAFT      Medications:  Current Outpatient Medications on File Prior to Visit  Medication Sig   Cholecalciferol 25 MCG (1000 UT) tablet Take 2,000 Units by mouth daily.   gabapentin  (NEURONTIN ) 100 MG capsule Take 1 capsule (100 mg total) by mouth 2 (two) times  daily.   GLYCINE PO Take 5 mLs by mouth daily.   loperamide (IMODIUM) 2 MG capsule Take 2 mg by mouth 3 (three) times daily as needed for diarrhea or loose stools.   pantoprazole (PROTONIX) 40 MG tablet Take 40 mg by mouth 2 (two) times daily.   Probiotic Product (PROBIOTIC PO) Take 1 tablet by mouth daily.   VITAMIN E PO Take 1 tablet by mouth  daily.   No current facility-administered medications on file prior to visit.    Allergies:  Allergies  Allergen Reactions   Morphine Nausea And Vomiting    Social History:  Social History   Socioeconomic History   Marital status: Widowed    Spouse name: Not on file   Number of children: 4   Years of education: Not on file   Highest education level: Bachelor's degree (e.g., BA, AB, BS)  Occupational History   Occupation: retired  Tobacco Use   Smoking status: Former    Current packs/day: 0.00    Average packs/day: 3.0 packs/day for 7.0 years (21.0 ttl pk-yrs)    Types: Cigarettes    Start date: 3    Quit date: 1980    Years since quitting: 45.9   Smokeless tobacco: Never  Vaping Use   Vaping status: Never Used  Substance and Sexual Activity   Alcohol use: Yes    Alcohol/week: 6.0 standard drinks of alcohol    Types: 6 Standard drinks or equivalent per week    Comment: 3 drinks, 2 days on the weekends   Drug use: No   Sexual activity: Yes  Other Topics Concern   Not on file  Social History Narrative   Not on file   Social Drivers of Health   Financial Resource Strain: Low Risk  (09/04/2024)   Overall Financial Resource Strain (CARDIA)    Difficulty of Paying Living Expenses: Not hard at all  Food Insecurity: No Food Insecurity (09/04/2024)   Hunger Vital Sign    Worried About Running Out of Food in the Last Year: Never true    Ran Out of Food in the Last Year: Never true  Transportation Needs: No Transportation Needs (09/04/2024)   PRAPARE - Administrator, Civil Service (Medical): No    Lack of  Transportation (Non-Medical): No  Physical Activity: Sufficiently Active (09/04/2024)   Exercise Vital Sign    Days of Exercise per Week: 3 days    Minutes of Exercise per Session: 60 min  Stress: No Stress Concern Present (09/04/2024)   [employer] of Occupational Health - Occupational Stress Questionnaire    Feeling of Stress: Only a little  Social Connections: Moderately Isolated (09/04/2024)   Social Connection and Isolation Panel    Frequency of Communication with Friends and Family: More than three times a week    Frequency of Social Gatherings with Friends and Family: More than three times a week    Attends Religious Services: Never    Database Administrator or Organizations: Yes    Attends Engineer, Structural: More than 4 times per year    Marital Status: Widowed  Intimate Partner Violence: Not At Risk (09/04/2024)   Humiliation, Afraid, Rape, and Kick questionnaire    Fear of Current or Ex-Partner: No    Emotionally Abused: No    Physically Abused: No    Sexually Abused: No   Social History   Tobacco Use  Smoking Status Former   Current packs/day: 0.00   Average packs/day: 3.0 packs/day for 7.0 years (21.0 ttl pk-yrs)   Types: Cigarettes   Start date: 17   Quit date: 1980   Years since quitting: 45.9  Smokeless Tobacco Never   Social History   Substance and Sexual Activity  Alcohol Use Yes   Alcohol/week: 6.0 standard drinks of alcohol   Types: 6 Standard drinks or equivalent per week   Comment: 3 drinks, 2 days on the weekends  Family History:  Family History  Problem Relation Age of Onset   Cancer Mother        stomach   COPD Mother    Heart disease Father    Cancer Father        colon   Diabetes Father    Polycystic ovary syndrome Daughter    Cancer Paternal Grandfather        rectal   Depression Daughter    Bipolar disorder Daughter     Past medical history, surgical history, medications, allergies, family history and  social history reviewed with patient today and changes made to appropriate areas of the chart.   Review of Systems - negative All other ROS negative except what is listed above and in the HPI.      Objective:    BP 128/82 (BP Location: Left Arm, Patient Position: Sitting, Cuff Size: Normal)   Pulse (!) 57   Temp 98.1 F (36.7 C) (Oral)   Resp 15   Ht 5' 5 (1.651 m)   Wt 159 lb 9.6 oz (72.4 kg)   LMP  (LMP Unknown)   SpO2 98%   BMI 26.56 kg/m   Wt Readings from Last 3 Encounters:  10/16/24 159 lb 9.6 oz (72.4 kg)  09/04/24 159 lb 9.6 oz (72.4 kg)  08/31/24 159 lb 6.4 oz (72.3 kg)    Physical Exam Vitals and nursing note reviewed. Exam conducted with a chaperone present.  Constitutional:      General: She is awake. She is not in acute distress.    Appearance: She is well-developed and well-groomed. She is not ill-appearing or toxic-appearing.  HENT:     Head: Normocephalic and atraumatic.     Right Ear: Hearing, tympanic membrane, ear canal and external ear normal. No drainage.     Left Ear: Hearing, tympanic membrane, ear canal and external ear normal. No drainage.     Nose: Nose normal.     Right Sinus: No maxillary sinus tenderness or frontal sinus tenderness.     Left Sinus: No maxillary sinus tenderness or frontal sinus tenderness.     Mouth/Throat:     Mouth: Mucous membranes are moist.     Pharynx: Oropharynx is clear. Uvula midline. No pharyngeal swelling, oropharyngeal exudate or posterior oropharyngeal erythema.  Eyes:     General: Lids are normal.        Right eye: No discharge.        Left eye: No discharge.     Extraocular Movements: Extraocular movements intact.     Conjunctiva/sclera: Conjunctivae normal.     Pupils: Pupils are equal, round, and reactive to light.     Visual Fields: Right eye visual fields normal and left eye visual fields normal.  Neck:     Thyroid: No thyromegaly.     Vascular: No carotid bruit.     Trachea: Trachea normal.   Cardiovascular:     Rate and Rhythm: Normal rate and regular rhythm.     Heart sounds: Normal heart sounds. No murmur heard.    No gallop.  Pulmonary:     Effort: Pulmonary effort is normal. No accessory muscle usage or respiratory distress.     Breath sounds: Normal breath sounds.  Chest:  Breasts:    Right: Normal.     Left: Normal.  Abdominal:     General: Bowel sounds are normal.     Palpations: Abdomen is soft. There is no hepatomegaly or splenomegaly.     Tenderness: There is  no abdominal tenderness.  Musculoskeletal:        General: Normal range of motion.     Cervical back: Normal range of motion and neck supple.     Right lower leg: No edema.     Left lower leg: No edema.  Lymphadenopathy:     Head:     Right side of head: No submental, submandibular, tonsillar, preauricular or posterior auricular adenopathy.     Left side of head: No submental, submandibular, tonsillar, preauricular or posterior auricular adenopathy.     Cervical: No cervical adenopathy.     Upper Body:     Right upper body: No supraclavicular, axillary or pectoral adenopathy.     Left upper body: No supraclavicular, axillary or pectoral adenopathy.  Skin:    General: Skin is warm and dry.     Capillary Refill: Capillary refill takes less than 2 seconds.     Findings: No rash.  Neurological:     Mental Status: She is alert and oriented to person, place, and time.     Gait: Gait is intact.     Deep Tendon Reflexes: Reflexes are normal and symmetric.     Reflex Scores:      Brachioradialis reflexes are 2+ on the right side and 2+ on the left side.      Patellar reflexes are 2+ on the right side and 2+ on the left side. Psychiatric:        Attention and Perception: Attention normal.        Mood and Affect: Mood normal.        Speech: Speech normal.        Behavior: Behavior normal. Behavior is cooperative.        Thought Content: Thought content normal.        Judgment: Judgment normal.     Results for orders placed or performed in visit on 04/11/24  WET PREP FOR TRICH, YEAST, CLUE   Collection Time: 04/11/24 11:19 AM   Specimen: Urine   Urine  Result Value Ref Range   Trichomonas Exam Negative Negative   Yeast Exam Negative Negative   Clue Cell Exam Negative Negative  Urinalysis, Routine w reflex microscopic   Collection Time: 04/11/24 11:19 AM  Result Value Ref Range   Specific Gravity, UA 1.020 1.005 - 1.030   pH, UA 6.0 5.0 - 7.5   Color, UA Yellow Yellow   Appearance Ur Clear Clear   Leukocytes,UA Negative Negative   Protein,UA Negative Negative/Trace   Glucose, UA Negative Negative   Ketones, UA Negative Negative   RBC, UA Negative Negative   Bilirubin, UA Negative Negative   Urobilinogen, Ur 0.2 0.2 - 1.0 mg/dL   Nitrite, UA Negative Negative   Microscopic Examination Comment       Assessment & Plan:   Problem List Items Addressed This Visit       Cardiovascular and Mediastinum   Essential hypertension - Primary   Chronic and improving. Continue Amlodipine 5 MG daily. Recommend she monitor BP at least a few mornings a week at home and document.  DASH diet at home.  Labs today: CBC, CMP, TSH.  Return in 6 months.        Relevant Medications   amLODipine (NORVASC) 5 MG tablet   Other Relevant Orders   CBC with Differential/Platelet   Comprehensive metabolic panel with GFR   TSH     Digestive   Eosinophilic esophagitis   Chronic, ongoing.  Followed by GI, continue this  collaboration.  Recent notes reviewed.  Risks of PPI use were discussed with patient including bone loss, C. Diff diarrhea, pneumonia, infections, CKD, electrolyte abnormalities.  Verbalizes understanding and chooses to continue the medication.       Relevant Orders   Magnesium     Musculoskeletal and Integument   Osteopenia of neck of left femur   Chronic, ongoing.  Recommend continue taking Vitamin D  2000 units at home and continue daily calcium.  Check Vit D and CMP  today. Sent in Tizanidine to use sparsely as needed for back pain.      Relevant Orders   VITAMIN D  25 Hydroxy (Vit-D Deficiency, Fractures)     Other   Long-term current use of benzodiazepine   Reviewed anxiety plan of care.      Relevant Orders   [id number] 11+Oxyco+Alc+Crt-Bund   Hyperlipidemia   Currently diet controlled with recent ASCVD 15.4%.  She prefers not to take medication, but discussed with her numbers are at level for medication.  She would like to obtain CT Calcium Scoring.  Continue diet focus and exercise -- recheck lipid panel today.      Relevant Medications   amLODipine (NORVASC) 5 MG tablet   Other Relevant Orders   Comprehensive metabolic panel with GFR   Lipid Panel w/o Chol/HDL Ratio   CT CARDIAC SCORING (SELF PAY ONLY)   Anxiety   Chronic, stable with minimal use of Ativan .  #30 pills lasts 6 + months most often on PDMP review and discussion with patient.  Up to date on refills.  She is aware of risks of benzo use chronically.  UDS due next 10/16/25, obtain contract next visit.  Denies SI/HI.      Relevant Medications   LORazepam  (ATIVAN ) 1 MG tablet   Other Relevant Orders   [id number] 11+Oxyco+Alc+Crt-Bund   Other Visit Diagnoses       Encounter for annual physical exam       Annual physical today with labs and health maintenance reviewed, discussed with patient.        Follow up plan: Return in about 6 months (around 04/16/2025) for HTN/HLD, ANXIETY.   LABORATORY TESTING:  - Pap smear: not applicable  IMMUNIZATIONS:   - Tdap: Tetanus vaccination status reviewed: last tetanus booster within 10 years. - Influenza: Up to date - Pneumovax: Up to date - Prevnar: Up to date - HPV: Not applicable - Zostavax vaccine: Up To Date  SCREENING: -Mammogram: Wishes not to continue, have been discontinued - Colonoscopy: Up to date  - Bone Density: Up to date due next 07/28/2027 -Hearing Test: Not applicable  -Spirometry: Not applicable   PATIENT  COUNSELING:   Advised to take 1 mg of folate supplement per day if capable of pregnancy.   Sexuality: Discussed sexually transmitted diseases, partner selection, use of condoms, avoidance of unintended pregnancy  and contraceptive alternatives.   Advised to avoid cigarette smoking.  I discussed with the patient that most people either abstain from alcohol or drink within safe limits (<=14/week and <=4 drinks/occasion for males, <=7/weeks and <= 3 drinks/occasion for females) and that the risk for alcohol disorders and other health effects rises proportionally with the number of drinks per week and how often a drinker exceeds daily limits.  Discussed cessation/primary prevention of drug use and availability of treatment for abuse.   Diet: Encouraged to adjust caloric intake to maintain  or achieve ideal body weight, to reduce intake of dietary saturated fat and total fat, to limit sodium intake  by avoiding high sodium foods and not adding table salt, and to maintain adequate dietary potassium and calcium preferably from fresh fruits, vegetables, and low-fat dairy products.    Stressed the importance of regular exercise  Injury prevention: Discussed safety belts, safety helmets, smoke detector, smoking near bedding or upholstery.   Dental health: Discussed importance of regular tooth brushing, flossing, and dental visits.    NEXT PREVENTATIVE PHYSICAL DUE IN 1 YEAR. Return in about 6 months (around 04/16/2025) for HTN/HLD, ANXIETY.

## 2024-10-16 NOTE — Assessment & Plan Note (Signed)
 Chronic, ongoing.  Followed by GI, continue this collaboration.  Recent notes reviewed.  Risks of PPI use were discussed with patient including bone loss, C. Diff diarrhea, pneumonia, infections, CKD, electrolyte abnormalities.  Verbalizes understanding and chooses to continue the medication.

## 2024-10-16 NOTE — Assessment & Plan Note (Signed)
 Chronic, stable with minimal use of Ativan .  #30 pills lasts 6 + months most often on PDMP review and discussion with patient.  Up to date on refills.  She is aware of risks of benzo use chronically.  UDS due next 10/16/25, obtain contract next visit.  Denies SI/HI.

## 2024-10-16 NOTE — Assessment & Plan Note (Signed)
 Currently diet controlled with recent ASCVD 15.4%.  She prefers not to take medication, but discussed with her numbers are at level for medication.  She would like to obtain CT Calcium Scoring.  Continue diet focus and exercise -- recheck lipid panel today.

## 2024-10-16 NOTE — Assessment & Plan Note (Signed)
 Chronic, ongoing.  Recommend continue taking Vitamin D  2000 units at home and continue daily calcium.  Check Vit D and CMP today. Sent in Tizanidine to use sparsely as needed for back pain.

## 2024-10-16 NOTE — Assessment & Plan Note (Signed)
 Reviewed anxiety plan of care.

## 2024-10-16 NOTE — Assessment & Plan Note (Signed)
 Chronic and improving. Continue Amlodipine 5 MG daily. Recommend she monitor BP at least a few mornings a week at home and document.  DASH diet at home.  Labs today: CBC, CMP, TSH.  Return in 6 months.

## 2024-10-17 ENCOUNTER — Ambulatory Visit: Payer: Self-pay | Admitting: Nurse Practitioner

## 2024-10-17 ENCOUNTER — Encounter: Admitting: Nurse Practitioner

## 2024-10-17 LAB — CBC WITH DIFFERENTIAL/PLATELET
Basophils Absolute: 0.1 x10E3/uL (ref 0.0–0.2)
Basos: 1 %
EOS (ABSOLUTE): 0.4 x10E3/uL (ref 0.0–0.4)
Eos: 6 %
Hematocrit: 42.3 % (ref 34.0–46.6)
Hemoglobin: 13.8 g/dL (ref 11.1–15.9)
Immature Grans (Abs): 0 x10E3/uL (ref 0.0–0.1)
Immature Granulocytes: 0 %
Lymphocytes Absolute: 2.6 x10E3/uL (ref 0.7–3.1)
Lymphs: 37 %
MCH: 32.8 pg (ref 26.6–33.0)
MCHC: 32.6 g/dL (ref 31.5–35.7)
MCV: 101 fL — ABNORMAL HIGH (ref 79–97)
Monocytes Absolute: 0.7 x10E3/uL (ref 0.1–0.9)
Monocytes: 9 %
Neutrophils Absolute: 3.2 x10E3/uL (ref 1.4–7.0)
Neutrophils: 47 %
Platelets: 287 x10E3/uL (ref 150–450)
RBC: 4.21 x10E6/uL (ref 3.77–5.28)
RDW: 12.2 % (ref 11.7–15.4)
WBC: 6.9 x10E3/uL (ref 3.4–10.8)

## 2024-10-17 LAB — COMPREHENSIVE METABOLIC PANEL WITH GFR
ALT: 19 IU/L (ref 0–32)
AST: 18 IU/L (ref 0–40)
Albumin: 4.4 g/dL (ref 3.8–4.8)
Alkaline Phosphatase: 62 IU/L (ref 49–135)
BUN/Creatinine Ratio: 21 (ref 12–28)
BUN: 18 mg/dL (ref 8–27)
Bilirubin Total: 0.5 mg/dL (ref 0.0–1.2)
CO2: 24 mmol/L (ref 20–29)
Calcium: 9.7 mg/dL (ref 8.7–10.3)
Chloride: 103 mmol/L (ref 96–106)
Creatinine, Ser: 0.87 mg/dL (ref 0.57–1.00)
Globulin, Total: 2.4 g/dL (ref 1.5–4.5)
Glucose: 93 mg/dL (ref 70–99)
Potassium: 4.2 mmol/L (ref 3.5–5.2)
Sodium: 140 mmol/L (ref 134–144)
Total Protein: 6.8 g/dL (ref 6.0–8.5)
eGFR: 71 mL/min/1.73 (ref >59)

## 2024-10-17 LAB — LIPID PANEL W/O CHOL/HDL RATIO
Cholesterol, Total: 245 mg/dL — ABNORMAL HIGH (ref 100–199)
HDL: 64 mg/dL (ref >39)
LDL Chol Calc (NIH): 140 mg/dL — ABNORMAL HIGH (ref 0–99)
Triglycerides: 230 mg/dL — ABNORMAL HIGH (ref 0–149)
VLDL Cholesterol Cal: 41 mg/dL — ABNORMAL HIGH (ref 5–40)

## 2024-10-17 LAB — TSH: TSH: 1.64 u[IU]/mL (ref 0.450–4.500)

## 2024-10-17 LAB — MAGNESIUM: Magnesium: 2.2 mg/dL (ref 1.6–2.3)

## 2024-10-17 LAB — VITAMIN D 25 HYDROXY (VIT D DEFICIENCY, FRACTURES): Vit D, 25-Hydroxy: 57.3 ng/mL (ref 30.0–100.0)

## 2024-10-17 NOTE — Progress Notes (Signed)
 Contacted via MyChart  Good morning Kristen May, your labs have returned and overall look great with exception of lipid panel. - Kidney function, creatinine and eGFR, remains normal, as is liver function, AST and ALT.  - Lipid panel continues to show elevations, but like we discussed lets see what your CT Calcium Score shows us . - Remainder of labs look fabulous.  [place] am headed [place] of the [place] for the next week, but if anything is needed one of the other awesome providers in office will be able to help out. Have a great day!! Keep being amazing!!  Thank you for allowing me to participate in your care.  I appreciate you. Kindest regards, Dvon Jiles

## 2024-10-18 LAB — DRUG SCREEN 764883 11+OXYCO+ALC+CRT-BUND

## 2024-10-20 LAB — CANNABINOID CONFIRMATION, UR
CANNABINOIDS: POSITIVE — AB
Carboxy THC GC/MS Conf: 26 ng/mL

## 2024-10-20 LAB — DRUG SCREEN 764883 11+OXYCO+ALC+CRT-BUND
Tramadol: 72.8 mg/dL (ref 20.0–300.0)
pH, Urine: 6.5 (ref 4.5–8.9)

## 2024-10-23 ENCOUNTER — Other Ambulatory Visit: Payer: Self-pay | Admitting: Nurse Practitioner

## 2024-10-25 ENCOUNTER — Ambulatory Visit

## 2024-10-25 NOTE — Telephone Encounter (Signed)
 Requested medication (s) are due for refill today: No  Requested medication (s) are on the active medication list: Yes  Last refill:  10/16/24  Future visit scheduled: Yes  Notes to clinic:  See pharmacy request.    Requested Prescriptions  Pending Prescriptions Disp Refills   tiZANidine  (ZANAFLEX ) 4 MG tablet [Pharmacy Med Name: TIZANIDINE  HCL 4 MG TABLET] 180 tablet 1    Sig: Take 0.5 tablets (2 mg total) by mouth every 6 (six) hours as needed for muscle spasms.     Not Delegated - Cardiovascular:  Alpha-2 Agonists - tizanidine  Failed - 10/25/2024 11:27 AM      Failed - This refill cannot be delegated      Passed - Valid encounter within last 6 months    Recent Outpatient Visits           1 week ago Essential hypertension   [place] [facility] [place], Melanie DASEN, NP   1 month ago Essential hypertension   [place] [facility] [place], [place] T, NP   3 months ago Acute left-sided low back pain with left-sided sciatica   [place] [facility] [place], Melanie DASEN, NP   6 months ago Anxiety   [facility]  [place], Melanie DASEN, NP

## 2024-10-30 ENCOUNTER — Ambulatory Visit
Admission: RE | Admit: 2024-10-30 | Discharge: 2024-10-30 | Disposition: A | Payer: Self-pay | Source: Ambulatory Visit | Attending: Nurse Practitioner | Admitting: Nurse Practitioner

## 2024-10-30 DIAGNOSIS — E782 Mixed hyperlipidemia: Secondary | ICD-10-CM | POA: Insufficient documentation

## 2024-10-31 NOTE — Progress Notes (Signed)
 Contacted via MyChart  Good evening Kristen May, your coronary calcium score has returned and you have a score of 7.01. At this level it is considered reasonable to initiate statin therapy for anyone >55 OR we could repeat this in 2 years and determine is any worsening in score. Let me know your thoughts? You are lower risk, but due to age and score a statin is reasonable. Any questions? Keep being amazing!!  Thank you for allowing me to participate in your care.  I appreciate you. Kindest regards, Ac Colan

## 2024-11-30 MED ORDER — ROSUVASTATIN CALCIUM 10 MG PO TABS: 10.0000 mg | ORAL_TABLET | Freq: Every day | ORAL | 3 refills | Status: AC

## 2024-12-12 DIAGNOSIS — M4802 Spinal stenosis, cervical region: Secondary | ICD-10-CM | POA: Insufficient documentation

## 2024-12-13 ENCOUNTER — Ambulatory Visit: Payer: Self-pay

## 2024-12-13 NOTE — Telephone Encounter (Signed)
 Called patient and scheduled an appointment for 2/3 at 2:20

## 2024-12-13 NOTE — Telephone Encounter (Signed)
 Pt only able to come in person today as she is going out of town tomorrow. Pt wondering if abx can be called in or if a visit is necessary. (Pt advised an in person visit is likely required due to needing a urine sample.) Please call back and advise, and schedule if possible.    FYI Only or Action Required?: Action required by provider: request for appointment.  Patient was last seen in primary care on 10/16/2024 by Valerio Melanie DASEN, NP.  Called Nurse Triage reporting Urinary Tract Infection.  Symptoms began several weeks ago.  Interventions attempted: Nothing.  Symptoms are: stable.  Triage Disposition: See Physician Within 24 Hours  Patient/caregiver understands and will follow disposition?: Yes      Reason for Triage: Possible UTI, lingering symptoms, no appt soon enough with PCP or office. Has a history of this.   Best contact: [phone]  Reason for Disposition  Urinating more frequently than usual (i.e., frequency) OR new-onset of the feeling of an urgent need to urinate (i.e., urgency)    Dysuria  Answer Assessment - Initial Assessment Questions 1. SYMPTOM: What's the main symptom you're concerned about? (e.g., frequency, incontinence) Dysuria burning   2. ONSET: When did the  symptoms  start?     A couple weeks  3. PAIN: Is there any pain? If Yes, ask: How bad is it? (Scale: 1-10; mild, moderate, severe)     6/10  4. CAUSE: What do you think is causing the symptoms?     Pt suspects a UTI  5. OTHER SYMPTOMS: Do you have any other symptoms? (e.g., blood in urine, fever, flank pain, pain with urination)     Denies  Protocols used: Urinary Symptoms-A-AH

## 2024-12-18 ENCOUNTER — Ambulatory Visit: Admitting: Nurse Practitioner

## 2024-12-18 ENCOUNTER — Encounter: Payer: Self-pay | Admitting: Nurse Practitioner

## 2024-12-18 VITALS — BP 117/71 | HR 54 | Temp 97.7°F | Ht 65.0 in | Wt 165.2 lb

## 2024-12-18 DIAGNOSIS — I10 Essential (primary) hypertension: Secondary | ICD-10-CM

## 2024-12-18 DIAGNOSIS — R399 Unspecified symptoms and signs involving the genitourinary system: Secondary | ICD-10-CM | POA: Diagnosis not present

## 2024-12-18 DIAGNOSIS — F419 Anxiety disorder, unspecified: Secondary | ICD-10-CM

## 2024-12-18 DIAGNOSIS — Z79899 Other long term (current) drug therapy: Secondary | ICD-10-CM

## 2024-12-18 DIAGNOSIS — E782 Mixed hyperlipidemia: Secondary | ICD-10-CM

## 2024-12-18 LAB — URINALYSIS, ROUTINE W REFLEX MICROSCOPIC
Bilirubin, UA: NEGATIVE
Glucose, UA: NEGATIVE
Ketones, UA: NEGATIVE
Nitrite, UA: NEGATIVE
Protein,UA: NEGATIVE
RBC, UA: NEGATIVE
Specific Gravity, UA: 1.01 (ref 1.005–1.030)
Urobilinogen, Ur: 0.2 mg/dL (ref 0.2–1.0)
pH, UA: 6.5 (ref 5.0–7.5)

## 2024-12-18 LAB — MICROSCOPIC EXAMINATION
Bacteria, UA: NONE SEEN
Epithelial Cells (non renal): NONE SEEN /HPF (ref 0–10)

## 2024-12-18 LAB — WET PREP FOR TRICH, YEAST, CLUE
Clue Cell Exam: NEGATIVE
Trichomonas Exam: NEGATIVE
Yeast Exam: NEGATIVE

## 2024-12-18 MED ORDER — ESTRADIOL 0.01 % VA CREA: TOPICAL_CREAM | VAGINAL | 12 refills | Status: AC

## 2024-12-18 NOTE — Assessment & Plan Note (Signed)
 Acute for a few weeks. UA in office overall reassuring today with only 1+ leuks, wet prep negative. ?some vaginal atrophy. Discussed with her a trial of Vaginal Estrace  to see if would benefit decreasing these symptoms. Educated on vaginal atrophy and estrace , how it is used and why. She would like to trial this. Script for vaginal estrace  sent in. Follow-up in April as scheduled.

## 2025-04-22 ENCOUNTER — Ambulatory Visit: Admitting: Nurse Practitioner

## 2025-09-10 ENCOUNTER — Ambulatory Visit
